# Patient Record
Sex: Male | Born: 1980 | Race: White | Hispanic: No | Marital: Single | Smoking: Never smoker
Health system: Southern US, Community
[De-identification: ages and names within clinical notes are randomized; demographics above are authoritative.]

## PROBLEM LIST (undated history)

## (undated) DIAGNOSIS — I10 Essential (primary) hypertension: Secondary | ICD-10-CM

## (undated) DIAGNOSIS — F419 Anxiety disorder, unspecified: Secondary | ICD-10-CM

---

## 2009-12-12 ENCOUNTER — Ambulatory Visit: Payer: Self-pay | Admitting: Family Medicine

## 2011-01-17 ENCOUNTER — Emergency Department: Payer: Self-pay | Admitting: *Deleted

## 2012-01-25 ENCOUNTER — Observation Stay: Payer: Self-pay | Admitting: Internal Medicine

## 2012-01-25 DIAGNOSIS — R079 Chest pain, unspecified: Secondary | ICD-10-CM

## 2012-01-25 LAB — CK TOTAL AND CKMB (NOT AT ARMC)
CK, Total: 149 U/L (ref 35–232)
CK, Total: 134 U/L (ref 35–232)
CK-MB: 0.9 ng/mL (ref 0.5–3.6)
CK-MB: 0.9 ng/mL (ref 0.5–3.6)
CK-MB: 0.6 ng/mL (ref 0.5–3.6)

## 2012-01-25 LAB — CBC
HGB: 15.1 g/dL (ref 13.0–18.0)
MCH: 29.6 pg (ref 26.0–34.0)
MCV: 86 fL (ref 80–100)
RBC: 5.11 10*6/uL (ref 4.40–5.90)

## 2012-01-25 LAB — BASIC METABOLIC PANEL
Anion Gap: 12 (ref 7–16)
BUN: 10 mg/dL (ref 7–18)
EGFR (Non-African Amer.): >60
Glucose: 140 mg/dL — ABNORMAL HIGH (ref 65–99)
Osmolality: 284 (ref 275–301)

## 2012-01-25 LAB — TROPONIN I
Troponin-I: <0.02 ng/mL
Troponin-I: <0.02 ng/mL
Troponin-I: <0.02 ng/mL

## 2012-01-25 LAB — SEDIMENTATION RATE: Erythrocyte Sed Rate: 1 mm/hr (ref 0–15)

## 2012-01-25 LAB — DRUG SCREEN, URINE
Amphetamines, Ur Screen: NEGATIVE (ref ?–1000)
Cocaine Metabolite,Ur ~~LOC~~: NEGATIVE (ref ?–300)
MDMA (Ecstasy)Ur Screen: NEGATIVE (ref ?–500)
Opiate, Ur Screen: NEGATIVE (ref ?–300)
Tricyclic, Ur Screen: NEGATIVE (ref ?–1000)

## 2012-01-25 LAB — MAGNESIUM: Magnesium: 1.8 mg/dL

## 2013-09-07 ENCOUNTER — Emergency Department: Payer: Self-pay | Admitting: Emergency Medicine

## 2013-09-07 LAB — CBC
HCT: 47.8 %
HGB: 16.5 g/dL
MCH: 30.3 pg
MCHC: 34.6 g/dL
MCV: 88 fL
Platelet: 224 x10 3/mm 3
RBC: 5.46 x10 6/mm 3
RDW: 13.3 %
WBC: 9.2 x10 3/mm 3

## 2013-09-07 LAB — BASIC METABOLIC PANEL WITH GFR
Anion Gap: 7
BUN: 12 mg/dL
Calcium, Total: 9.1 mg/dL
Chloride: 103 mmol/L
Co2: 25 mmol/L
Creatinine: 1.42 mg/dL — ABNORMAL HIGH
EGFR (African American): >60
EGFR (Non-African Amer.): >60
Glucose: 119 mg/dL — ABNORMAL HIGH
Osmolality: 271
Potassium: 3.8 mmol/L
Sodium: 135 mmol/L — ABNORMAL LOW

## 2013-09-07 LAB — TROPONIN I: Troponin-I: <0.02 ng/mL

## 2013-09-07 LAB — LIPASE, BLOOD: Lipase: 254 U/L

## 2014-08-07 NOTE — H&P (Signed)
PATIENT NAME:  Erik Mckay, Erik Mckay MR#:  [id number] DATE OF BIRTH:  Aug 23, 1980  DATE OF ADMISSION:  01/25/2012  PRIMARY C[facility] PHYSIC[facility]: [facility].   HISTORY OF PRESENT ILLNESS: This is a 34 year old Caucasian male patient with no significant past medical history who presents to the Emergency Room complaining of acute onset of left-sided chest pain radiating to the left arm. This is associated with shortness of breath, nausea, and palpitations. The patient has had similar pain over the last three days. This tends to start while the patient is sleeping at night and resolves by morning. He has not noticed any pain with exertion. He had similar episodes twice in the prior month, was [facility], and was sent home with diagnosis of gastroesophageal reflux disease. He mentions that his pain feels better when he sits up. This tends to have a pleuritic component with worsening on taking a deep breath at times. He also mentioned some pain in his legs and says that when he presses on his legs his pain in the chest gets worse. He has some tingling in his fingers bilaterally at times.   His dad had multiple cardiac catheterizations with his initial diagnosis of coronary artery disease with multiple arteries involved, in his 30s. Presently his dad is in his 6250s and has had five heart attacks, as per the patient, with multiple cardiac catheterizations and no bypass. Multiple family members on his father's side have coronary artery disease.   Here his EKG shows T wave inversions in the inferior leads, two sets of cardiac enzymes normal, and secondary to her a typical symptoms and the family history the patient is being admitted for further work-up.   PAST MEDICAL HISTORY:  1. Tobacco abuse. 2. Marijuana abuse.   SOCIAL HISTORY: The patient smokes 2 to 3 cigarettes a week. Occasional alcohol. Smokes marijuana and he smoked marijuana earlier today.   ALLERGIES: Ciprofloxacin, Levaquin, Phenergan and Bactrim.    HOME MEDICATIONS: Prilosec 20 mg oral once a day.   REVIEW OF SYSTEMS: CONSTITUTIONAL: No fatigue, weakness, weight loss, or weight gain. EYES: No discharge, pain, redness, or blurred vision. ENT: Has sinus congestion since having sinusitis infection a few weeks back. No tinnitus. CARDIOVASCULAR: Has chest pain. No edema or orthopnea. RESPIRATORY: Has shortness of breath. No wheezing or cough or hemoptysis. GI: No nausea, vomiting, diarrhea, or abdominal pain. GENITOURINARY: No dysuria or hematuria. SKIN: No petechiae, rash, or ulcers. MUSCULOSKELETAL: No joint pain, swelling, or redness. No myalgias. NEUROLOGIC: No numbness, weakness, or dysarthria. HEMATOLOGIC: No anemia, bleeding, or easy bruising. ENDOCRINE: No polyuria or thyroid problems. PSYCHIATRIC: No anxiety or depression.   PHYSICAL EXAMINATION:   VITAL SIGNS: Temperature 98, pulse 72, blood pressure 119/63, and saturating 99% on room air, breathing 15 per minute.  GENERAL: Obese, Caucasian male patient lying in bed, comfortable, conversational, cooperative with exam.   PSYCHIATRIC: Awake, alert and oriented x3, anxious, good judgment.   HEENT: Atraumatic, normocephalic. Oral mucosa moist and pink. External ears and nose normal. No pallor. No icterus. Pupils bilaterally equal and reactive to light.   NECK: Supple. No thyromegaly. No palpable lymph nodes. Trachea midline. No carotid bruit or JVD.   CARDIOVASCULAR: S1 and S2 regular rate and rhythm without any murmurs. Peripheral pulses 2+. Has tenderness on pushing on the left side.   RESPIRATORY: Normal work of breathing. Clear to auscultation on both sides.   GASTROINTESTINAL: Soft abdomen, nontender. Bowel sounds present. No hepatosplenomegaly palpable.   SKIN: Warm and dry. No  petechiae, rash, or ulcers.   MUSCULOSKELETAL: No joint swelling, redness, or effusion of the large joints. Normal muscle tone.   NEUROLOGICAL: Motor strength five out of five in upper and lower  extremities. Sensation to fine touch intact all over.   LYMPHATIC: No cervical or axillary lymphadenopathy.   LABORATORY, DIAGNOSTIC AND RADIOLOGIC DATA: Lab studies show glucose 140, BUN 10, creatinine 1.39, sodium 142, potassium 3.4, and chloride 105. CK 179/149. MB negative. Troponin less than 0.02 x2. WBC 9.5, hemoglobin 15.1, and platelets 234.   EKG shows normal sinus rhythm with T wave inversions in inferior leads. No prior EKGs to compare with.   Chest x-ray shows no acute abnormalities.   ASSESSMENT AND PLAN:  1. Atypical chest pain in a 34 year old patient with family history of premature coronary artery disease in his father in his 22s. Two sets of cardiac enzymes are normal, but does have EKG changes and seems to have worsening symptoms over the last three days compared to previous month. Could be unstable angina. With EKG and atypical symptoms, we will consult cardiology for further help with the case. The patient will be started on aspirin.  2. Tobacco abuse. I have counseled the patient for more than three minutes to quit smoking. The patient mentions that he does not smoke much and would like to quit considering his risks to the heart after explaining.  3. Marijuana abuse. This could be involved with his symptoms. We will check a urine drug screen.  4. DVT prophylaxis with TEDs.  CODE STATUS: FULL CODE.             TIME SPENT: Time spent today on this case was more than 55 minutes with greater than 50% time spent in coordination of care. ____________________________ Molinda Bailiff. Rukiya Hodgkins, MD srs:slb Mckay: 01/25/2012 07:06:28 ET T: 01/25/2012 08:23:27 ET JOB#: [id number]  cc: Wardell Heath R. Nga Rabon, MD, <Dictator> [facility] Orie Fisherman MD ELECTRONICALLY SIGNED 02/15/2012 13:57

## 2014-08-07 NOTE — Discharge Summary (Signed)
PATIENT NAME:  Erik Mckay, Erik Mckay MR#:  [id number] DATE OF BIRTH:  1980-08-10  DATE OF ADMISSION:  01/25/2012 DATE OF DISCHARGE:  01/25/2012  DIAGNOSES:  1. Atypical chest pain, unclear etiology, likely noncardiac. Differential diagnosis includes anxiety attack, gastroesophageal reflux disease.   2. Smoking, marijuana use.   DISPOSITION: Patient is being discharged home.   FOLLOW UP: Follow up with Dr. Rockey Situ and PCP with [facility] in 1 to 2 weeks after discharge.   DIET: Regular.   ACTIVITY: As tolerated.   DISCHARGE MEDICATIONS:  1. Prilosec 20 mg daily.  2. Propranolol 20 mg. Patient has been advised to take half tablet as needed for symptoms/palpitations and can increase to 20 mg as needed once a day for symptoms.   CONSULTATION: Cardiology consultation with Dr. Rockey Situ.   LABORATORY, DIAGNOSTIC, AND RADIOLOGICAL DATA: Treadmill stress test showed no evidence of ischemia or EKG changes. Chest x-ray showed no acute abnormalities. CBC normal. ESR normal. Cardiac enzymes negative. UDS positive for marijuana. Glucose 140, BUN 10, creatinine 1.39, sodium 142, potassium 3.4.   HOSPITAL COURSE: Patient is a 34 year old male with history of smoking and marijuana use and family history of coronary artery disease who presented with very atypical symptoms. He also had multiple somatic complaints. His chest pain was felt to be very atypical and possibly noncardiac. He was evaluated by Dr. Rockey Situ who recommended doing an inpatient treadmill stress test in view of patient's symptoms and family history. His stress test showed no evidence of ischemia. Differential diagnoses for his symptoms include anxiety and panic attacks and possible GI source since patient has acid reflux. He has been recommended to take propranolol as needed once a day for tachycardia. He has been given prescription and instructions for how to take it. Dr. Rockey Situ with follow the patient in his office if he continues to have  symptoms and arrange for outpatient Holter if symptoms persist. Patient was discharged home in a stable condition.   TIME SPENT: 45 minutes.   ____________________________ Cherre Huger, MD sp:cms D: 01/26/2012 15:07:33 ET T: 01/27/2012 09:51:03 ET JOB#: [id number]  cc: Cherre Huger, MD, <Dictator> [facility]  Cherre Huger MD ELECTRONICALLY SIGNED 01/27/2012 14:18

## 2014-08-07 NOTE — Consult Note (Signed)
General Aspect 34 year old Caucasian male patient with h/o GERD, long hx of atypical sx including left side chest and neck pain,  presenting to the Emergency Room complaining of acute onset of left-sided chest pain radiating to the left arm while supine. Cardiology was consulted for chest pain sx.  He reports waxing waning sx for the past 8 months. Sx worse ast year after sinusitis. Recently, he has had left chest pain radiating to his left arm, and numerous other  symptoms, for the past few nights when supine. Also with some palpitations. He does report having stress/anxiety. He has not noticed any pain with exertion. He had similar episodes twice in the prior month, was seen at [facility], and was sent home with diagnosis of gastroesophageal reflux disease. He mentions that his pain feels better when he sits up. This tends to have a pleuritic component with worsening on taking a deep breath at times. He also mentioned some pain in his legs.  His dad had multiple cardiac catheterizations with his initial diagnosis of coronary artery disease with multiple arteries involved, in his 34s. Presently his dad is in his 36s and has had five heart attacks, as per the patient, with multiple cardiac catheterizations and no bypass. Multiple family members on his father's side have coronary artery disease.   Here his EKG shows T wave inversions in the inferior leads, two sets of cardiac enzymes normal, and secondary to her a typical symptoms and the family history the patient is being admitted for further work-up.   Physical Exam:   GEN well developed, well nourished, no acute distress    HEENT red conjunctivae    NECK supple    RESP normal resp effort  clear BS    CARD Regular rate and rhythm    ABD denies tenderness  soft    LYMPH negative neck    EXTR negative edema    SKIN normal to palpation    NEURO motor/sensory function intact    PSYCH alert, A+O to time, place, person, good insight   Review  of Systems:   Subjective/Chief Complaint chest pain, left arm numb, neck and head pain, ai in legs, LUQ discomfort    General: Trouble sleeping    Skin: No Complaints    ENT: No Complaints    Eyes: No Complaints    Neck: No Complaints    Respiratory: No Complaints    Cardiovascular: Chest pain or discomfort    Gastrointestinal: No Complaints    Genitourinary: No Complaints    Vascular: No Complaints    Musculoskeletal: No Complaints    Neurologic: No Complaints    Hematologic: No Complaints    Endocrine: No Complaints    Psychiatric: No Complaints    Review of Systems: All other systems were reviewed and found to be negative    Medications/Allergies Reviewed Medications/Allergies reviewed     GERD:        Admit Diagnosis:   CHEST PAIN: 25-Jan-2012, Active, CHEST PAIN  Home Medications: Medication Instructions Status  Prilosec  orally  Active   Lab Results: Routine Chem:  07-Oct-13 00:18    Magnesium, Serum 1.8 (1.8-2.4 THERAPEUTIC RANGE: 4-7 mg/dL TOXIC: > 10 mg/dL  -----------------------)   BUN 10   Creatinine (comp)  1.39   Sodium, Serum 142   Potassium, Serum  3.4   Chloride, Serum 105   CO2, Serum 25   Calcium (Total), Serum 8.9   Anion Gap 12   Osmolality (calc) 284   eGFR (  African American) >60   eGFR (Non-African American) >60 (eGFR values <100mL/min/1.73 m2 may be an indication of chronic kidney disease (CKD). Calculated eGFR is useful in patients with stable renal function. The eGFR calculation will not be reliable in acutely ill patients when serum creatinine is changing rapidly. It is not useful in  patients on dialysis. The eGFR calculation may not be applicable to patients at the low and high extremes of body sizes, pregnant women, and vegetarians.)  Cardiac:  07-Oct-13 00:18    Troponin I < 0.02 (0.00-0.05 0.05 ng/mL or less: NEGATIVE  Repeat testing in 3-6 hrs  if clinically indicated. >0.05 ng/mL: POTENTIAL  MYOCARDIAL  INJURY. Repeat  testing in 3-6 hrs if  clinically indicated. NOTE: An increase or decrease  of 30% or more on serial  testing suggests a  clinically important change)   CK, Total 179   CPK-MB, Serum 0.9 (Result(s) reported on 25 Jan 2012 at 01:07AM.)    05:10    Troponin I < 0.02 (0.00-0.05 0.05 ng/mL or less: NEGATIVE  Repeat testing in 3-6 hrs  if clinically indicated. >0.05 ng/mL: POTENTIAL  MYOCARDIAL INJURY. Repeat  testing in 3-6 hrs if  clinically indicated. NOTE: An increase or decrease  of 30% or more on serial  testing suggests a  clinically important change)   CK, Total 149   CPK-MB, Serum 0.9 (Result(s) reported on 25 Jan 2012 at 05:32AM.)  Routine Hem:  07-Oct-13 00:18    WBC (CBC) 9.5   RBC (CBC) 5.11   Hemoglobin (CBC) 15.1   Hematocrit (CBC) 43.8   Platelet Count (CBC) 234 (Result(s) reported on 25 Jan 2012 at 12:56AM.)   MCV 86   MCH 29.6   MCHC 34.5   RDW 13.7   EKG:   Interpretation EKG shows NSR with nonspecific T wave abn in III, AVF    ciprofloxacin: Unknown  Levaquin: Unknown  Phenergan: Unknown  Bactrim: Unknown  Vital Signs/Nurse's Notes: **Vital Signs.:   07-Oct-13 08:22   Vital Signs Type Admission   Temperature Temperature (F) 98.1   Celsius 36.7   Temperature Source Oral   Pulse Pulse 65   Respirations Respirations 18   Systolic BP Systolic BP 749   Diastolic BP (mmHg) Diastolic BP (mmHg) 87   Mean BP 103   Pulse Ox % Pulse Ox % 97   Pulse Ox Activity Level  At rest   Oxygen Delivery Room Air/ 21 %     Impression 34 year old Caucasian male patient with h/o GERD, long hx of atypical sx including left side chest and neck pain,  presenting to the Emergency Room complaining of acute onset of left-sided chest pain radiating to the left arm while supine. Cardiology was consulted for chest pain sx.  1) Atypical chest pain cardiac enz neg x 2 treadmill planned for today given family hx. Continue aspirin, follow up with PMD if  treadmill normal Other differential includes anxiety/panic atacks, musculoskeletal pain, pericarditis (somewhat atypical given the numerous other complaints), recreational drug use side effects   2)Gerd: would continue omeprazole, consider doubling dose if sx persist  3) Recreactional drug use: recomended smoking cessation, MAJ   Electronic Signatures: Ida Rogue (MD)  (Signed 07-Oct-13 09:59)  Authored: General Aspect/Present Illness, History and Physical Exam, Review of System, Past Medical History, Health Issues, Home Medications, Labs, EKG , Allergies, Vital Signs/Nurse's Notes, Impression/Plan   Last Updated: 07-Oct-13 09:59 by Ida Rogue (MD)

## 2016-03-06 ENCOUNTER — Emergency Department
Admission: EM | Admit: 2016-03-06 | Discharge: 2016-03-06 | Disposition: A | Discharge status: Home / Self Care | Payer: Medicaid Other | Attending: Student in an Organized Health Care Education/Training Program | Admitting: Student in an Organized Health Care Education/Training Program

## 2016-03-06 ENCOUNTER — Encounter: Payer: Self-pay | Admitting: Emergency Medicine

## 2016-03-06 ENCOUNTER — Emergency Department: Payer: Medicaid Other

## 2016-03-06 DIAGNOSIS — Y999 Unspecified external cause status: Secondary | ICD-10-CM | POA: Diagnosis not present

## 2016-03-06 DIAGNOSIS — Y9389 Activity, other specified: Secondary | ICD-10-CM | POA: Diagnosis not present

## 2016-03-06 DIAGNOSIS — W228XXA Striking against or struck by other objects, initial encounter: Secondary | ICD-10-CM | POA: Diagnosis not present

## 2016-03-06 DIAGNOSIS — Y929 Unspecified place or not applicable: Secondary | ICD-10-CM | POA: Diagnosis not present

## 2016-03-06 DIAGNOSIS — I10 Essential (primary) hypertension: Secondary | ICD-10-CM | POA: Insufficient documentation

## 2016-03-06 DIAGNOSIS — F17200 Nicotine dependence, unspecified, uncomplicated: Secondary | ICD-10-CM | POA: Diagnosis not present

## 2016-03-06 DIAGNOSIS — Z23 Encounter for immunization: Secondary | ICD-10-CM | POA: Diagnosis not present

## 2016-03-06 DIAGNOSIS — M795 Residual foreign body in soft tissue: Secondary | ICD-10-CM | POA: Insufficient documentation

## 2016-03-06 HISTORY — DX: Anxiety disorder, unspecified: F41.9

## 2016-03-06 HISTORY — DX: Essential (primary) hypertension: I10

## 2016-03-06 MED ORDER — TETANUS-DIPHTH-ACELL PERTUSSIS 5-2.5-18.5 LF-MCG/0.5 IM SUSP
0.5000 mL | Freq: Once | INTRAMUSCULAR | Status: AC
  Administered 2016-03-06: 0.5 mL via INTRAMUSCULAR

## 2016-03-06 MED ORDER — CEPHALEXIN 500 MG PO CAPS: 500.0000 mg | ORAL_CAPSULE | Freq: Two times a day (BID) | ORAL | 0 refills | Status: AC

## 2016-03-06 MED ORDER — LIDOCAINE-EPINEPHRINE (PF) 1 %-1:200000 IJ SOLN
10.0000 mL | Freq: Once | INTRAMUSCULAR | Status: AC
  Administered 2016-03-06: 30 mL

## 2016-03-06 MED ORDER — LIDOCAINE-EPINEPHRINE 2 %-1:100000 IJ SOLN: 1.7000 mL | Freq: Once | INTRAMUSCULAR | Status: DC

## 2016-03-06 MED ORDER — LIDOCAINE-EPINEPHRINE (PF) 1 %-1:200000 IJ SOLN
INTRAMUSCULAR | Status: AC
  Administered 2016-03-06: 30 mL
  Filled 2016-03-06: qty 30

## 2016-03-06 MED ORDER — NAPROXEN 500 MG PO TBEC: 500.0000 mg | DELAYED_RELEASE_TABLET | Freq: Two times a day (BID) | ORAL | 0 refills | Status: AC

## 2016-03-06 MED ORDER — KETOROLAC TROMETHAMINE 60 MG/2ML IM SOLN
60.0000 mg | Freq: Once | INTRAMUSCULAR | Status: AC
  Administered 2016-03-06: 60 mg via INTRAMUSCULAR
  Filled 2016-03-06: qty 2

## 2016-03-06 MED ORDER — TETANUS-DIPHTH-ACELL PERTUSSIS 5-2.5-18.5 LF-MCG/0.5 IM SUSP
INTRAMUSCULAR | Status: AC
  Administered 2016-03-06: 0.5 mL via INTRAMUSCULAR
  Filled 2016-03-06: qty 0.5

## 2016-03-06 NOTE — ED Provider Notes (Signed)
[facility] Emergency Department Provider Note  ____________________________________________   First MD Initiated Contact with Patient 03/06/16 2104     (approximate)  I have reviewed the triage vital signs and the nursing notes.   HISTORY  Chief Complaint Foreign Body    HPI Erik Mckay is a 35 y.o. male presenting to the emergency department after particulate matter from a piece of a hammer lodged into his leftforearm while working on the tailgate of his pickup at 6:00 PM today. Patient describes pain as aching and 5/10 in intensity. Patient has been actively using the extremity. He denies numbness, tingling or coldness of the involved extremity. Patient states that he is unsure about last tetanus booster. He denies fever and chills. He has not taken any medication to relieve symptoms. He is left handed.    Past Medical History:  Diagnosis Date  . Anxiety   . Hypertension     There are no active problems to display for this patient.   History reviewed. No pertinent surgical history.  Prior to Admission medications   Medication Sig Start Date End Date Taking? Authorizing Provider  cephALEXin (KEFLEX) 500 MG capsule Take 1 capsule (500 mg total) by mouth 2 (two) times daily. 03/06/16 03/16/16  Orvil FeilJaclyn M Leonie Amacher, PA-C  naproxen (EC NAPROSYN) 500 MG EC tablet Take 1 tablet (500 mg total) by mouth 2 (two) times daily with a meal. 03/06/16 03/06/17  Orvil FeilJaclyn M Brighid Koch, PA-C    Allergies Bactrim [sulfamethoxazole-trimethoprim]; Levaquin [levofloxacin in d5w]; and Phenergan [promethazine hcl]  History reviewed. No pertinent family history.  Social History Social History  Substance Use Topics  . Smoking status: Never Smoker  . Smokeless tobacco: Current User  . Alcohol use Yes    Review of Systems Constitutional: No fever/chills Respiratory: Denies shortness of breath. Gastrointestinal: No abdominal pain.  No nausea, no vomiting.    Musculoskeletal: Has left forearm pain. Skin: Patient has 1 cm laceration at entrance site for foreign body. Neurological: Negative for headaches, focal weakness or numbness.  ____________________________________________   PHYSICAL EXAM:  VITAL SIGNS: ED Triage Vitals  Enc Vitals Group     BP 03/06/16 2101 (!) 162/81     Pulse Rate 03/06/16 2101 83     Resp 03/06/16 2101 16     Temp 03/06/16 2101 98.6 F (37 C)     Temp Source 03/06/16 2101 Oral     SpO2 03/06/16 2101 100 %     Weight 03/06/16 2102 225 lb (102.1 kg)     Height 03/06/16 2102 5\' 9"  (1.753 m)     Head Circumference --      Peak Flow --      Pain Score 03/06/16 2102 4     Pain Loc --      Pain Edu? --      Excl. in GC? --    Constitutional: Alert and oriented. Well appearing and in no acute distress. Eyes: Conjunctivae are normal. PERRL. EOMI. Head: Atraumatic. Nose: No congestion/rhinnorhea. Cardiovascular: Normal rate, regular rhythm. Grossly normal heart sounds.  Good peripheral circulation. Respiratory: Normal respiratory effort.  No retractions. Lungs CTAB. Musculoskeletal: Patient has 5 out of 5 strength in the upper extremities bilaterally. Reflexes 2+ symmetrically and bilaterally.  Neurologic:  Normal speech and language. No gross focal neurologic deficits are appreciated. Skin: Patient has 1 cm laceration from foreign body entrance at left forearm. Hematologic: Patient has palpable radial and ulnar pulses in the left upper extremity.  LABS (all labs  ordered are listed, but only abnormal results are displayed)  Labs Reviewed - No data to display ____________________________________________   RADIOLOGY  I, Orvil FeilJaclyn M Alandra Sando, personally viewed and evaluated these images (plain radiographs) as part of my medical decision making, as well as reviewing the written report by the radiologist.  Forearm x-ray:  Single metallic foreign body (5x11 mm) at the radial volar aspect of the proximal forearm.     PROCEDURES  Procedures:  Foreign Body Removal:  Procedure explained and permission received from Erik Mckay  Body Part: Left forearm Anesthesia: 3mL of 1% lidocaine with epi Technique: A 1 mm incision was made 3 cm superior to foreign body entrance site at place of maximal tenderness in association with x-ray coordinates. A foreign body could not be localized. A single simple interrupted 4-0 Ethilon suture was placed at incision site for foreign body removal. Patient was advised to have primary care provider remove suture in one week.  Blood loss: 3 mL  Patient tolerated the procedure well with no immediate complications.    INITIAL IMPRESSION / ASSESSMENT AND PLAN / ED COURSE  Pertinent labs & imaging results that were available during my care of the patient were reviewed by me and considered in my medical decision making (see chart for details).  Clinical Course    Assessment and Plan:  Foreign Body:  Patient had particulate matter from a hammer lodged into his left forearm today at approximately 6:00 PM. DG forearm x-ray results indicated a single metallic foreign body (5x11 mm) at the radial volar aspect of the proximal forearm. Patient underwent foreign body exploration in the emergency department. A foreign body could not be removed. A single simple interrupted suture using 4-0 Ethilon was placed at foreign body exploration site. Patient was advised to have suture removed in 1 week by his primary care provider. Patient received Tdap (BOOSTRIX) injection in the emergency department. Patient received an injection of Toradol. He was discharged on Keflex. Patient education was provided regarding return precautions. All patient questions were answered. A referral was made to surgery. ____________________________________________   FINAL CLINICAL IMPRESSION(S) / ED DIAGNOSES  Final diagnoses:  Foreign body (FB) in soft tissue      NEW MEDICATIONS STARTED DURING THIS  VISIT:  Discharge Medication List as of 03/06/2016 10:24 PM    START taking these medications   Details  cephALEXin (KEFLEX) 500 MG capsule Take 1 capsule (500 mg total) by mouth 2 (two) times daily., Starting Fri 03/06/2016, Until Mon 03/16/2016, Print    naproxen (EC NAPROSYN) 500 MG EC tablet Take 1 tablet (500 mg total) by mouth 2 (two) times daily with a meal., Starting Fri 03/06/2016, Until Sat 03/06/2017, Print         Note:  This document was prepared using Dragon voice recognition software and may include unintentional dictation errors.    Orvil FeilJaclyn M Daxter Paule, PA-C 03/06/16 2255    Willy EddyPatrick Robinson, MD 03/06/16 [phone]

## 2016-03-06 NOTE — ED Notes (Signed)
Pt with less than 0.25 cm puncture wound noted to posterior left mid forearm with controlled bleeding. Pt states a piece of hammer broke off striking skin. Pt states he believes foreign body in in arm.

## 2016-03-06 NOTE — ED Triage Notes (Signed)
Pt states piece of fb from hammer under left forearm. Area noted to left mid anterior forearm.

## 2016-10-11 ENCOUNTER — Emergency Department
Admission: EM | Admit: 2016-10-11 | Discharge: 2016-10-11 | Disposition: A | Discharge status: Home / Self Care | Payer: Self-pay | Attending: Emergency Medicine | Admitting: Emergency Medicine

## 2016-10-11 ENCOUNTER — Encounter: Payer: Self-pay | Admitting: Emergency Medicine

## 2016-10-11 DIAGNOSIS — I10 Essential (primary) hypertension: Secondary | ICD-10-CM | POA: Insufficient documentation

## 2016-10-11 DIAGNOSIS — Z79899 Other long term (current) drug therapy: Secondary | ICD-10-CM | POA: Insufficient documentation

## 2016-10-11 DIAGNOSIS — F410 Panic disorder [episodic paroxysmal anxiety] without agoraphobia: Secondary | ICD-10-CM | POA: Insufficient documentation

## 2016-10-11 MED ORDER — DIAZEPAM 5 MG PO TABS: 5.0000 mg | ORAL_TABLET | Freq: Three times a day (TID) | ORAL | 0 refills | Status: DC | PRN

## 2016-10-11 MED ORDER — DIAZEPAM 2 MG PO TABS
2.0000 mg | ORAL_TABLET | Freq: Once | ORAL | Status: AC
  Administered 2016-10-11: 2 mg via ORAL
  Filled 2016-10-11: qty 1

## 2016-10-11 NOTE — ED Provider Notes (Signed)
[facility] Emergency Department Provider Note   ____________________________________________   I have reviewed the triage vital signs and the nursing notes.   HISTORY  Chief Complaint Panic Attack    HPI Erik Mckay is a 36 y.o. male presents with anxiety that developed earlier this morning. Patient has a history of anxiety that he treats with Celexa and Valium he takes as needed. Patient is scheduled to see his provider that prescribed the above medications tomorrow. Patient reports compliance with Celexa and his medication bottle and remaining pills seems aligned with the history he is reporting. Patient reports no acute anxiety attacks over the last 4 months however he awoke this morning and after breakfast he is unsure of the trigger but developed significant panic symptoms and was out of his PRN valium. Patient denies fever, chills, headache, vision changes, chest pain, chest tightness, shortness of breath, abdominal pain, nausea and vomiting.  Past Medical History:  Diagnosis Date  . Anxiety   . Hypertension     There are no active problems to display for this patient.   History reviewed. No pertinent surgical history.  Prior to Admission medications   Medication Sig Start Date End Date Taking? Authorizing Provider  citalopram (CELEXA) 40 MG tablet Take 40 mg by mouth daily.   Yes [provider]  diazepam (VALIUM) 5 MG tablet Take 5 mg by mouth every 6 (six) hours as needed for anxiety (1/2 to 1 tabs as needed).   Yes [provider]  diazepam (VALIUM) 5 MG tablet Take 1 tablet (5 mg total) by mouth every 8 (eight) hours as needed for anxiety (Take 1/2 or whole tablet as needed for anxiety.). 10/11/16 10/11/17  Jashira Cotugno M, PA-C  naproxen (EC NAPROSYN) 500 MG EC tablet Take 1 tablet (500 mg total) by mouth 2 (two) times daily with a meal. 03/06/16 03/06/17  Orvil FeilWoods, Jaclyn M, PA-C    Allergies Bactrim  [sulfamethoxazole-trimethoprim]; Levaquin [levofloxacin in d5w]; and Phenergan [promethazine hcl]  History reviewed. No pertinent family history.  Social History Social History  Substance Use Topics  . Smoking status: Never Smoker  . Smokeless tobacco: Current User  . Alcohol use Yes    Review of Systems Constitutional: Negative for fever/chills Eyes: No visual changes. Cardiovascular: Denies chest pain. Respiratory: Denies cough. Denies shortness of breath. Musculoskeletal: Negative for back pain. Negative for generalized body aches. Skin:Negative for rash. Neurological: Negative for headaches.  Psychiatric: Mood normal, affect anxious.  ____________________________________________   PHYSICAL EXAM:  VITAL SIGNS: ED Triage Vitals  Enc Vitals Group     BP 10/11/16 1157 (!) 161/81     Pulse Rate 10/11/16 1157 81     Resp 10/11/16 1157 20     Temp 10/11/16 1157 98.4 F (36.9 C)     Temp Source 10/11/16 1157 Oral     SpO2 10/11/16 1157 100 %     Weight 10/11/16 1158 220 lb (99.8 kg)     Height 10/11/16 1158 5\' 8"  (1.727 m)     Head Circumference --      Peak Flow --      Pain Score --      Pain Loc --      Pain Edu? --      Excl. in GC? --     Constitutional: Alert and oriented. Well appearing and mild distress.  Head: Normocephalic and atraumatic. Eyes: Conjunctivae are normal. PERRL.  Cardiovascular: Normal rate, regular rhythm. Normal distal pulses. Respiratory: Normal respiratory  effort.  Musculoskeletal: Nontender with normal range of motion in all extremities. Neurologic: Normal speech and language.  Skin:  Skin is warm, dry and intact. No rash noted. Psychiatric: Mood normal. Affect acute anxiety. ____________________________________________   LABS (all labs ordered are listed, but only abnormal results are displayed)  Labs Reviewed - No data to  display ____________________________________________  EKG None ____________________________________________  RADIOLOGY None ____________________________________________   PROCEDURES  Procedure(s) performed: no    Critical Care performed: no ____________________________________________   INITIAL IMPRESSION / ASSESSMENT AND PLAN / ED COURSE  Pertinent labs & imaging results that were available during my care of the patient were reviewed by me and considered in my medical decision making (see chart for details).  Patient presented with mild anxiety attack today that began this morning during breakfast. Patient normally manages his anxiety with Celexa and valium as needed. Patient has been compliant with Celexa regimen and takes Valium as needed. Physical exam and history are consistent with acute anxiety attack. Patient given 2 mg Valium and patient's anxiety symptoms resolved during the course of care in emergency department. Patient given 2 days supply of Valium prescription. Patient is scheduled for a follow-up visit with his provider and will be updating his prescriptions. Patient informed of clinical course, understand medical decision-making process, and agree with plan.  Patient was advised to follow up with PCP as needed and was also advised to return to the emergency department for symptoms that change or worsen.      If controlled substance prescribed during this visit, 12 month history viewed on the [facility] prior to issuing an initial prescription for Schedule II or III opiod. ____________________________________________   FINAL CLINICAL IMPRESSION(S) / ED DIAGNOSES  Final diagnoses:  Anxiety attack       NEW MEDICATIONS STARTED DURING THIS VISIT:  Discharge Medication List as of 10/11/2016  1:15 PM    START taking these medications   Details  !! diazepam (VALIUM) 5 MG tablet Take 1 tablet (5 mg total) by mouth every 8 (eight) hours as needed for anxiety  (Take 1/2 or whole tablet as needed for anxiety.)., Starting Sun 10/11/2016, Until Mon 10/11/2017, Print     !! - Potential duplicate medications found. Please discuss with provider.       Note:  This document was prepared using Dragon voice recognition software and may include unintentional dictation errors.    Clois Comber, PA-C 10/11/16 1803    Jeanmarie Plant, MD 10/12/16 1115

## 2016-10-11 NOTE — ED Triage Notes (Signed)
Pt presents to ED c/o anxiety attack. Pt states he normally takes celexa and usually has valium where he takes 1/2 pill but he does not have any. Pt states today he feels more anxious for no real reason; mostly from known triggers. Pt states he just needs something so he can calm down. No SI/HI thoughts

## 2016-10-11 NOTE — Discharge Instructions (Signed)
Follow up with your provider or next schedule visit.

## 2016-10-11 NOTE — ED Notes (Signed)
See triage note  States he is out of valium which he takes as needed  Woke up this am with feeling anxious  Can;t be still

## 2017-07-10 ENCOUNTER — Emergency Department
Admission: EM | Admit: 2017-07-10 | Discharge: 2017-07-10 | Disposition: A | Discharge status: Home / Self Care | Payer: Self-pay | Attending: Emergency Medicine | Admitting: Emergency Medicine

## 2017-07-10 ENCOUNTER — Other Ambulatory Visit: Payer: Self-pay

## 2017-07-10 DIAGNOSIS — I10 Essential (primary) hypertension: Secondary | ICD-10-CM | POA: Insufficient documentation

## 2017-07-10 DIAGNOSIS — L509 Urticaria, unspecified: Secondary | ICD-10-CM | POA: Insufficient documentation

## 2017-07-10 MED ORDER — PREDNISONE 10 MG PO TABS: ORAL_TABLET | ORAL | 0 refills | Status: DC

## 2017-07-10 MED ORDER — RANITIDINE HCL 150 MG PO TABS: 150.0000 mg | ORAL_TABLET | Freq: Two times a day (BID) | ORAL | 0 refills | Status: DC

## 2017-07-10 MED ORDER — DEXAMETHASONE SODIUM PHOSPHATE 10 MG/ML IJ SOLN
10.0000 mg | Freq: Once | INTRAMUSCULAR | Status: AC
  Administered 2017-07-10: 10 mg via INTRAMUSCULAR
  Filled 2017-07-10: qty 1

## 2017-07-10 NOTE — ED Provider Notes (Signed)
[facility] Emergency Department Provider Note  ___________________________________________   None    (approximate)  I have reviewed the triage vital signs and the nursing notes.   HISTORY  Chief Complaint Allergic Reaction  HPI Erik Mckay is a 37 y.o. male is here complaining of rash and itching often known for the past 3 days.  Patient states that he woke up again this morning with the rash and took 50 mg of Benadryl 1 hour prior to arrival in the emergency department.  He denies any difficulty breathing, speaking, swallowing.  He denies any previous allergies.  Patient states the rash comes and goes.  Patient took pictures of rash at its worse last evening.   Past Medical History:  Diagnosis Date  . Anxiety   . Hypertension     There are no active problems to display for this patient.   No past surgical history on file.  Prior to Admission medications   Medication Sig Start Date End Date Taking? Authorizing Provider  citalopram (CELEXA) 40 MG tablet Take 40 mg by mouth daily.    [provider]  predniSONE (DELTASONE) 10 MG tablet Take 6 tablets  today, on day 2 take 5 tablets, day 3 take 4 tablets, day 4 take 3 tablets, day 5 take  2 tablets and 1 tablet the last day 07/10/17   Tommi RumpsSummers, Christasia Angeletti L, PA-C  ranitidine (ZANTAC) 150 MG tablet Take 1 tablet (150 mg total) by mouth 2 (two) times daily. 07/10/17 07/10/18  Tommi RumpsSummers, Dreonna Hussein L, PA-C    Allergies Bactrim [sulfamethoxazole-trimethoprim]; Levaquin [levofloxacin in d5w]; and Phenergan [promethazine hcl]  No family history on file.  Social History Social History   Tobacco Use  . Smoking status: Never Smoker  . Smokeless tobacco: Current User  Substance Use Topics  . Alcohol use: Yes  . Drug use: No    Review of Systems Constitutional: No fever/chills Eyes: No visual changes. ENT: No sore throat. Cardiovascular: Denies chest pain. Respiratory: Denies shortness of  breath. Gastrointestinal: No abdominal pain.  No nausea, no vomiting.  Musculoskeletal: Negative for back pain. Skin: Positive for rash. Neurological: Negative for headaches ____________________________________________   PHYSICAL EXAM:  VITAL SIGNS: ED Triage Vitals  Enc Vitals Group     BP 07/10/17 0646 (!) 158/95     Pulse Rate 07/10/17 0646 70     Resp 07/10/17 0646 18     Temp 07/10/17 0646 97.9 F (36.6 C)     Temp Source 07/10/17 0646 Oral     SpO2 07/10/17 0646 100 %     Weight 07/10/17 0642 230 lb (104.3 kg)     Height 07/10/17 0642 5\' 10"  (1.778 m)     Head Circumference --      Peak Flow --      Pain Score --      Pain Loc --      Pain Edu? --      Excl. in GC? --    Constitutional: Alert and oriented. Well appearing and in no acute distress. Eyes: Conjunctivae are normal.  Head: Atraumatic. Nose: No congestion/rhinnorhea. Mouth/Throat: Mucous membranes are moist.  Oropharynx non-erythematous.  Uvula is midline, no edema present. Neck: No stridor.   Cardiovascular: Normal rate, regular rhythm. Grossly normal heart sounds.  Good peripheral circulation. Respiratory: Normal respiratory effort.  No retractions. Lungs CTAB. Musculoskeletal: moves upper and lower extremities.   Neurologic:  Normal speech and language. No gross focal neurologic deficits are appreciated.  Skin:  Skin is warm, dry and intact.  Psychiatric: Mood and affect are normal. Speech and behavior are normal.  ____________________________________________   LABS (all labs ordered are listed, but only abnormal results are displayed)  Labs Reviewed - No data to display   PROCEDURES  Procedure(s) performed: None  Procedures  Critical Care performed: No  ____________________________________________   INITIAL IMPRESSION / ASSESSMENT AND PLAN / ED COURSE Patient presents to the emergency department with history of urticaria and is confirmed by pictures that he took on his phone last  evening of his rash.  Currently rash has improved.  Patient continues to itch and places that are slightly erythematous.  Patient was given Decadron 10 mg IM along with a prednisone pack to taper beginning with 60 mg to taper over the next 6 days.  He was also given a prescription for Zantac 50 mg twice daily.  He may continue taking Benadryl if needed for his symptoms.  He is return to the emergency department if any severe worsening of his symptoms.  We does discussed having allergy testing done should he continue to have problems in the future.  Information was given to him for this purpose. ____________________________________________   FINAL CLINICAL IMPRESSION(S) / ED DIAGNOSES  Final diagnoses:  Urticaria     ED Discharge Orders        Ordered    predniSONE (DELTASONE) 10 MG tablet     07/10/17 0726    ranitidine (ZANTAC) 150 MG tablet  2 times daily     07/10/17 1610       Note:  This document was prepared using Dragon voice recognition software and may include unintentional dictation errors.    Tommi Rumps, PA-C 07/10/17 1312    Arnaldo Natal, MD 07/10/17 [phone]

## 2017-07-10 NOTE — Discharge Instructions (Signed)
Follow-up with [facility] if any continued problems other allergy testing could be done.  You may discontinue taking Benadryl for itching and begin taking Zyrtec or Claritin as these medications will not cause drowsiness.  Begin taking prednisone as directed decreasing by 1 tablet each day and also Zantac 1 twice a day.  You may discontinue taking your Prilosec while taking the Zantac. Return to the emergency department if any severe worsening of your symptoms or inability to swallow your secretions.

## 2017-07-10 NOTE — ED Triage Notes (Addendum)
Patient reports itching and rash over body off/on for the past 3 days.  Patient took Benadryl 50 mg approximately 1 hour prior to arrival.  Patient is speaking in complete sentences and controlling own secretions without difficulty.  No acute respiratory distress noted.

## 2017-07-10 NOTE — ED Notes (Signed)
Per pt unknown allergic reaction x2days. Denies any SOB or oral swelling. No hives at this time but did have yesterday. Speech clear resp even and unlabored

## 2017-07-10 NOTE — ED Notes (Signed)
Patient ambulatory to stat desk without difficulty or distress noted.  Patient reports itching and rash off/on for the past 3 days.  Reports notices when wakes up.  Patient reports he took Benadryl 50mg  approximately 1 hour prior to arrival.

## 2017-08-11 ENCOUNTER — Other Ambulatory Visit: Payer: Self-pay | Admitting: Emergency Medicine

## 2018-06-10 IMAGING — CR DG FOREARM 2V*L*
1 series · 2 of 2 positions shown · non-contrast
Comparison: None.

CLINICAL DATA: Puncture wound due to broken Lore.

EXAM:
LEFT FOREARM - 2 VIEW

[Series 1: x forearm ap left · 0.14mm/px · 2 of 2 slices shown]
[im 1/2]
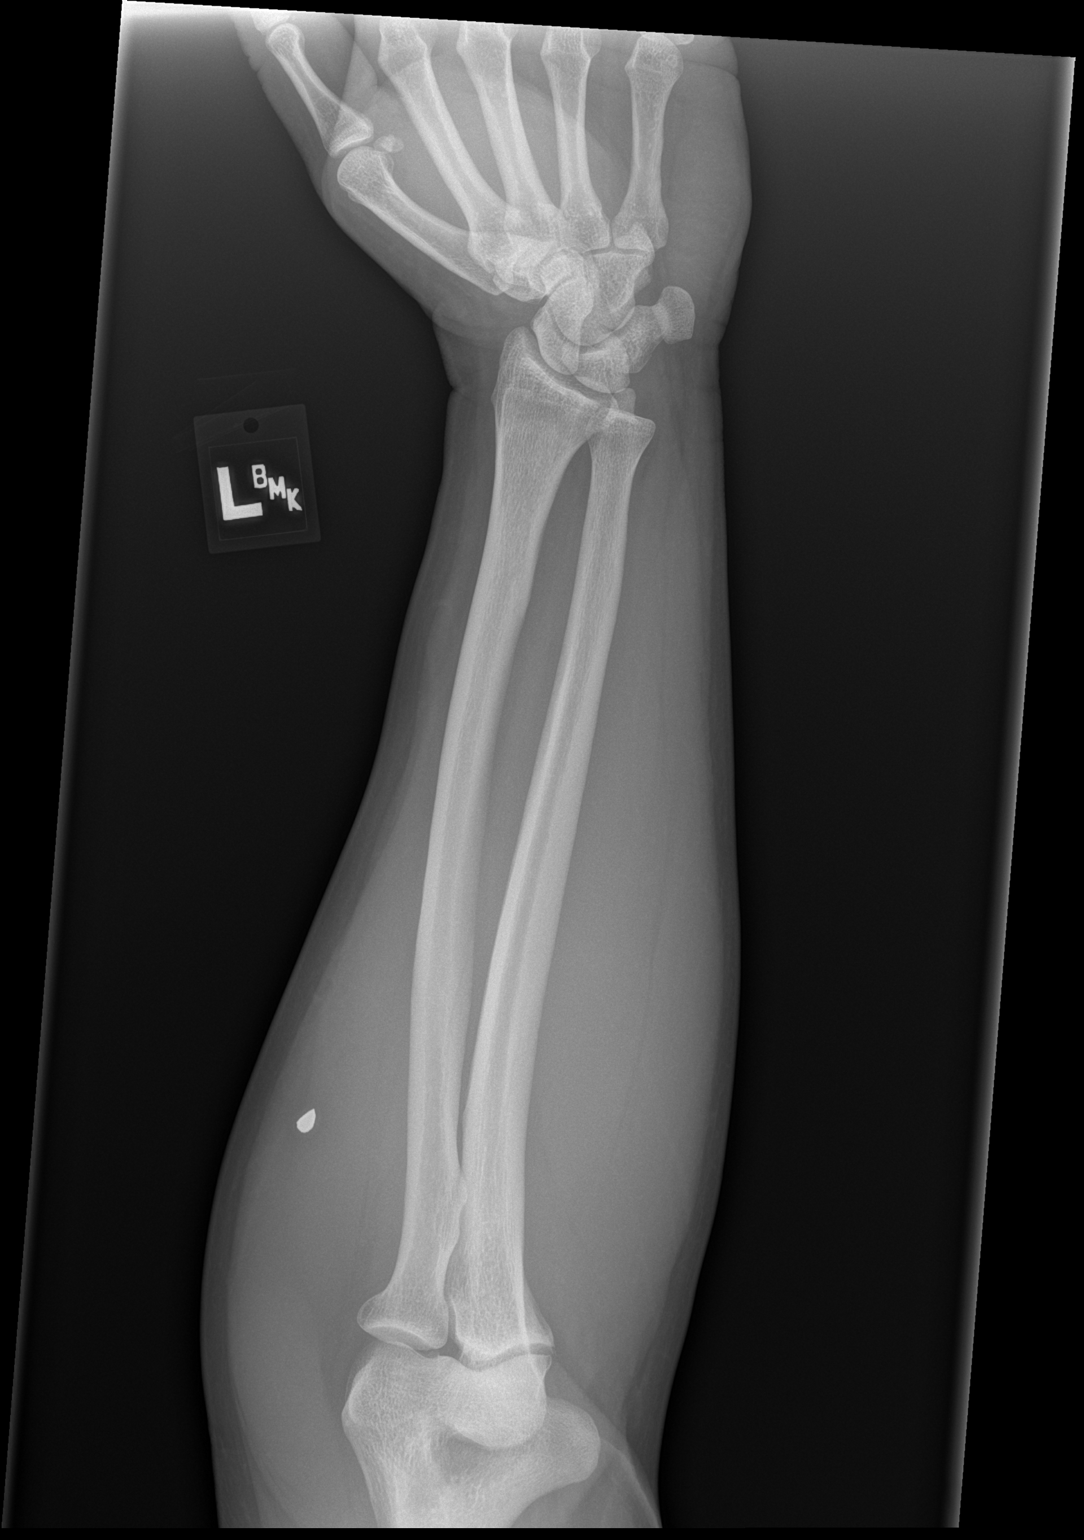
[im 2/2]
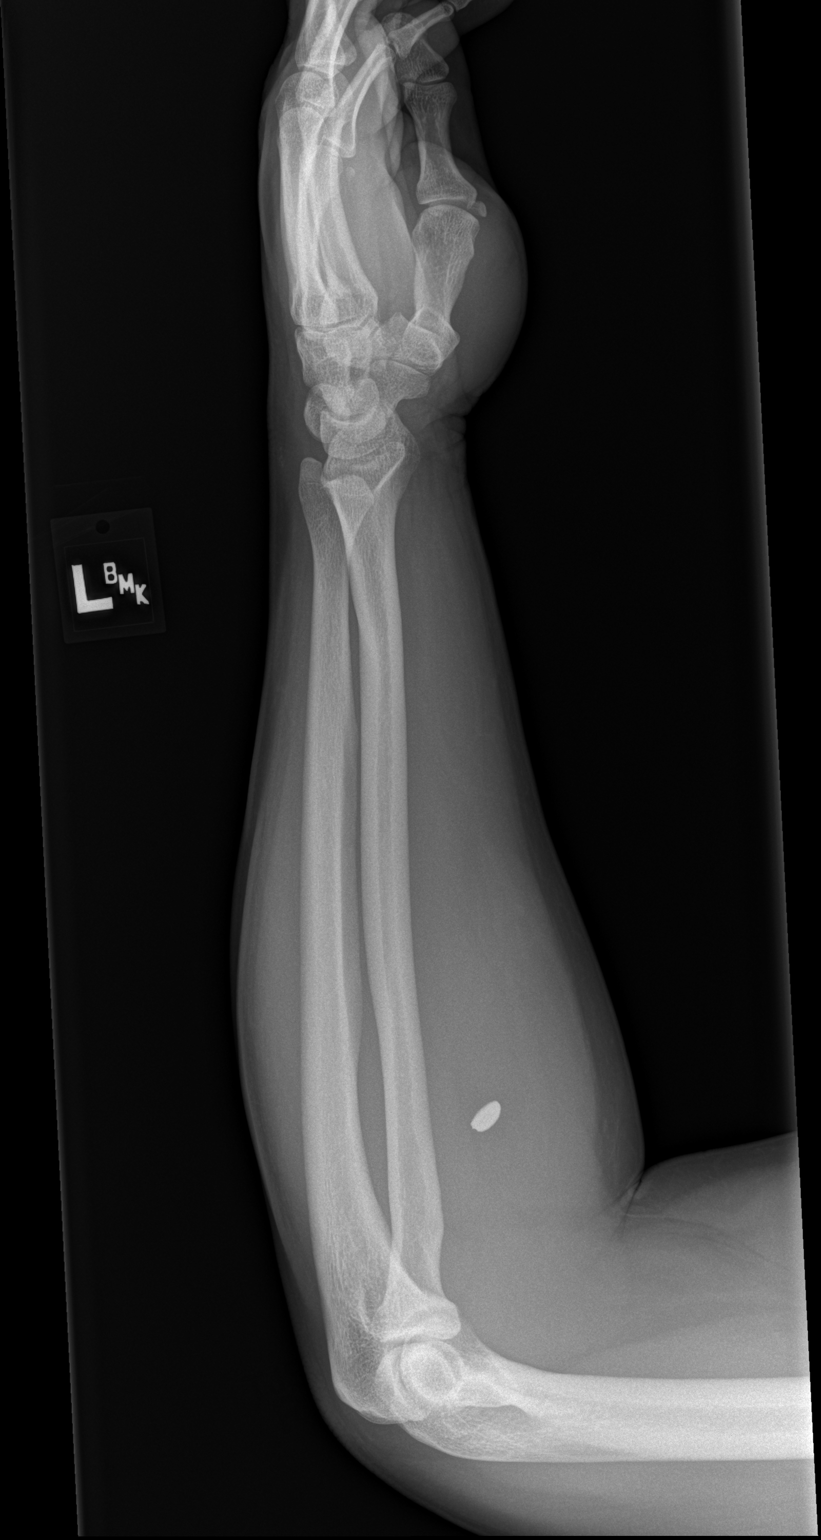

[2 of 2 positions shown; findings below may reference images not displayed]

FINDINGS: There is a single metallic foreign body located in the soft tissues
at the radial volar aspect of the proximal forearm, measuring
approximately 5 x 11 mm. No bony injury. The foreign body is no more
than 1.5 cm deep to the nearest skin surface.
IMPRESSION: Metallic soft tissue foreign body measuring 5 x 11 mm at the radial
volar aspect of the proximal forearm. No bony injury.

## 2018-10-18 ENCOUNTER — Encounter: Payer: Self-pay | Admitting: *Deleted

## 2018-10-18 ENCOUNTER — Emergency Department
Admission: EM | Admit: 2018-10-18 | Discharge: 2018-10-18 | Disposition: A | Discharge status: Home / Self Care | Payer: Self-pay | Attending: Emergency Medicine | Admitting: Emergency Medicine

## 2018-10-18 ENCOUNTER — Emergency Department: Payer: Self-pay

## 2018-10-18 ENCOUNTER — Other Ambulatory Visit: Payer: Self-pay

## 2018-10-18 DIAGNOSIS — Z79899 Other long term (current) drug therapy: Secondary | ICD-10-CM | POA: Insufficient documentation

## 2018-10-18 DIAGNOSIS — R079 Chest pain, unspecified: Secondary | ICD-10-CM | POA: Insufficient documentation

## 2018-10-18 DIAGNOSIS — I10 Essential (primary) hypertension: Secondary | ICD-10-CM | POA: Insufficient documentation

## 2018-10-18 LAB — BASIC METABOLIC PANEL
Anion gap: 10 (ref 5–15)
BUN: 10 mg/dL (ref 6–20)
CO2: 25 mmol/L (ref 22–32)
Calcium: 8.8 mg/dL — ABNORMAL LOW (ref 8.9–10.3)
Chloride: 98 mmol/L (ref 98–111)
Creatinine, Ser: 1.06 mg/dL (ref 0.61–1.24)
GFR calc Af Amer: >60 mL/min (ref >60)
GFR calc non Af Amer: >60 mL/min (ref >60)
Glucose, Bld: 99 mg/dL (ref 70–99)
Potassium: 3.6 mmol/L (ref 3.5–5.1)
Sodium: 133 mmol/L — ABNORMAL LOW (ref 135–145)

## 2018-10-18 LAB — CBC
HCT: 43 % (ref 39.0–52.0)
Hemoglobin: 15 g/dL (ref 13.0–17.0)
MCH: 30.7 pg (ref 26.0–34.0)
MCHC: 34.9 g/dL (ref 30.0–36.0)
MCV: 88.1 fL (ref 80.0–100.0)
Platelets: 189 10*3/uL (ref 150–400)
RBC: 4.88 MIL/uL (ref 4.22–5.81)
RDW: 11.9 % (ref 11.5–15.5)
WBC: 5.9 10*3/uL (ref 4.0–10.5)
nRBC: 0 % (ref 0.0–0.2)

## 2018-10-18 LAB — FIBRIN DERIVATIVES D-DIMER (ARMC ONLY): Fibrin derivatives D-dimer (ARMC): 270.6 ng/mL (FEU) (ref 0.00–499.00)

## 2018-10-18 LAB — HEPATIC FUNCTION PANEL
ALT: 110 U/L — ABNORMAL HIGH (ref 0–44)
AST: 71 U/L — ABNORMAL HIGH (ref 15–41)
Albumin: 4.3 g/dL (ref 3.5–5.0)
Alkaline Phosphatase: 54 U/L (ref 38–126)
Bilirubin, Direct: 0.2 mg/dL (ref 0.0–0.2)
Indirect Bilirubin: 0.8 mg/dL (ref 0.3–0.9)
Total Bilirubin: 1 mg/dL (ref 0.3–1.2)
Total Protein: 7.4 g/dL (ref 6.5–8.1)

## 2018-10-18 LAB — TROPONIN I (HIGH SENSITIVITY)
Troponin I (High Sensitivity): 3 ng/L (ref <18)
Troponin I (High Sensitivity): 2 ng/L (ref <18)

## 2018-10-18 MED ORDER — NAPROXEN 500 MG PO TABS: 500.0000 mg | ORAL_TABLET | Freq: Two times a day (BID) | ORAL | 0 refills | Status: AC

## 2018-10-18 NOTE — ED Triage Notes (Signed)
PT to ED via EMS with left sided chest pain that worsens with deep breathing. Pain is radiating to left shoulder. EMS gave pt 1 Nitro Spray and 324 mg ASA that pt reports decreased the pain. Last time pt experienced this pain he was dx with GERD and anxiety. Pt reports congestion with cough in the morning that resolves throughout the day.

## 2018-10-18 NOTE — Discharge Instructions (Signed)
Please take the anti inflammatory as prescribed.  Follow up with the primary care provider of your choice as soon as possible for recheck of your blood pressure. Return to the ER for any symptom of concerns if unable to see primary care.

## 2018-10-18 NOTE — ED Provider Notes (Signed)
[facility] Emergency Department Provider Note  ____________________________________________   None    (approximate)  I have reviewed the triage vital signs and the nursing notes.   HISTORY  Chief Complaint Chest Pain   HPI Erik Mckay is a 38 y.o. male who presents to the emergency department for treatment and evaluation of left side chest pain that increases with deep breath. Pain radiates into the left shoulder. EMS gave NTG spray with some relief. He also had 324 mg ASA. Pain started around 8am. Typically he has chest pain with anxiety/panic attack and takes Ativan with relief. He took it at about 9:30 without any change. Pain is in the left chest and goes through to the left shoulder blade with deep breath or movement. Previous anxiety and GERD have not radiated into the back.    Past Medical History:  Diagnosis Date  . Anxiety   . Hypertension     There are no active problems to display for this patient.   History reviewed. No pertinent surgical history.  Prior to Admission medications   Medication Sig Start Date End Date Taking? Authorizing Provider  citalopram (CELEXA) 20 MG tablet Take 20 mg by mouth daily.    Yes [provider]  omeprazole (PRILOSEC) 20 MG capsule Take 20 mg by mouth daily.   Yes [provider]  naproxen (NAPROSYN) 500 MG tablet Take 1 tablet (500 mg total) by mouth 2 (two) times daily with a meal. 10/18/18   Ferris Fielden B, FNP    Allergies Bactrim [sulfamethoxazole-trimethoprim], Levaquin [levofloxacin in d5w], and Phenergan [promethazine hcl]  History reviewed. No pertinent family history.  Social History Social History   Tobacco Use  . Smoking status: Never Smoker  . Smokeless tobacco: Current User  Substance Use Topics  . Alcohol use: Yes  . Drug use: No    Review of Systems  Constitutional: No fever/chills. Eyes: No visual changes. ENT: No sore throat. Cardiovascular:  Positive for chest pain. Positive for pleuritic pain. negative for palpitations. Negative for leg pain or swelling. Respiratory: Negative for shortness of breath. Gastrointestinal: Negative for abdominal pain. no nausea, no vomiting.  No diarrhea.  No constipation. Genitourinary: Negative for dysuria. Musculoskeletal: Positive for back pain.  Skin: Negative for rash, lesion, wound. Neurological: Negative for headaches, focal weakness or numbness.  ____________________________________________   PHYSICAL EXAM:  VITAL SIGNS: ED Triage Vitals  Enc Vitals Group     BP 10/18/18 1221 (!) 153/94     Pulse Rate 10/18/18 1221 84     Resp 10/18/18 1221 16     Temp 10/18/18 1221 99.5 F (37.5 C)     Temp Source 10/18/18 1221 Oral     SpO2 10/18/18 1221 97 %     Weight 10/18/18 1222 229 lb 15 oz (104.3 kg)     Height --      Head Circumference --      Peak Flow --      Pain Score 10/18/18 1222 5     Pain Loc --      Pain Edu? --      Excl. in [place]? --     Constitutional: Alert and oriented. Well appearing and in no acute distress. Normal mental status. Eyes: Conjunctivae are normal. PERRL. Head: Atraumatic. Nose: No congestion/rhinnorhea. Mouth/Throat: Mucous membranes are moist.  Oropharynx non-erythematous. Tongue normal in size and color. Neck: No stridor. No carotid bruit appreciated on exam. Hematological/Lymphatic/Immunilogical: No cervical lymphadenopathy. Cardiovascular: Normal rate, regular rhythm.  Grossly normal heart sounds.  Good peripheral circulation. Respiratory: Normal respiratory effort.  No retractions. Lungs CTAB. Gastrointestinal: Soft and nontender. No distention. No abdominal bruits. No CVA tenderness. Genitourinary: Exam deferred. Musculoskeletal: No lower extremity tenderness. No edema of extremities. Neurologic:  Normal speech and language. No gross focal neurologic deficits are appreciated. Skin:  Skin is warm, dry and intact. No rash noted. Psychiatric: Mood  and affect are normal. Speech and behavior are normal.  ____________________________________________   LABS (all labs ordered are listed, but only abnormal results are displayed)  Labs Reviewed  BASIC METABOLIC PANEL - Abnormal; Notable for the following components:      Result Value   Sodium 133 (*)    Calcium 8.8 (*)    All other components within normal limits  HEPATIC FUNCTION PANEL - Abnormal; Notable for the following components:   AST 71 (*)    ALT 110 (*)    All other components within normal limits  CBC  TROPONIN I (HIGH SENSITIVITY)  TROPONIN I (HIGH SENSITIVITY)  FIBRIN DERIVATIVES D-DIMER ([facility] ONLY)   ____________________________________________  EKG  ED ECG REPORT I, Eligio Angert, FNP-BC personally viewed and interpreted this ECG.   Date: 10/18/2018  EKG Time: 1216  Rate: 76  Rhythm: unchanged from previous tracings, normal sinus rhythm  Axis: normal  Intervals:none  ST&T Change: No ST elevation  ____________________________________________  RADIOLOGY  ED MD interpretation:  No acute cardiopulmonary abnormality per radiology.  Official radiology report(s): Dg Chest 2 View  Result Date: 10/18/2018 CLINICAL DATA:  Left-sided chest pain EXAM: CHEST - 2 VIEW COMPARISON:  09/07/2013 FINDINGS: The heart size and mediastinal contours are within normal limits. Both lungs are clear. The visualized skeletal structures are unremarkable. IMPRESSION: No active cardiopulmonary disease. Electronically Signed   By: Elige KoHetal  Patel   On: 10/18/2018 13:31    ____________________________________________   PROCEDURES  Procedure(s) performed: None  Procedures  Critical Care performed: No  ____________________________________________   INITIAL IMPRESSION / ASSESSMENT AND PLAN / ED COURSE  As part of my medical decision making, I reviewed the following data within the electronic MEDICAL RECORD NUMBER Old EKG reviewed and Notes from prior ED visits  38 year old male  presenting to the emergency department for evaluation of chest pain in the left chest with radiation into the left subscapular area with deep breath or movement. Symptoms started this morning around 8am while he was running a bobcat at work. He does not recall feeling a pull or having any injury. Labs, ECG, and cardiac work up will be ordered including d dimer. He is low probability for PE, but has pleuritic pain.  ----------------------------------------- 3:16 PM on 10/18/2018 -----------------------------------------  Patient resting with eyes closed. Labs including d dimer are reassuring. Initial troponin is 3. Chest x-ray shows no acute abnormality per radiology. EKG is unchanged from previous. Plan will be to do second troponin and discharge home if negative.   ----------------------------------------- 5:01 PM on 10/18/2018 -----------------------------------------  Troponin is normal. He is to be discharged home with prescription for naprosyn. He states he believes his pain is related to a "pulled muscle." He is to follow up with primary care as soon as possible for repeat blood pressure. He was given ER return precautions as well.   ____________________________________________   FINAL CLINICAL IMPRESSION(S) / ED DIAGNOSES  Final diagnoses:  Nonspecific chest pain  Hypertension, unspecified type     ED Discharge Orders         Ordered    naproxen (NAPROSYN)  500 MG tablet  2 times daily with meals     10/18/18 1656           Note:  This document was prepared using Dragon voice recognition software and may include unintentional dictation errors.    Chinita Pesterriplett, Ji Fairburn B, FNP 10/18/18 1702    Phineas SemenGoodman, [place], MD 10/18/18 [phone]

## 2018-10-18 NOTE — ED Notes (Signed)
[place] drawn and sent to lab for repeat troponin.

## 2019-04-17 ENCOUNTER — Other Ambulatory Visit: Payer: Self-pay

## 2019-04-17 ENCOUNTER — Encounter: Payer: Self-pay | Admitting: Emergency Medicine

## 2019-04-17 ENCOUNTER — Emergency Department
Admission: EM | Admit: 2019-04-17 | Discharge: 2019-04-17 | Disposition: A | Discharge status: Home / Self Care | Payer: Self-pay | Attending: Emergency Medicine | Admitting: Emergency Medicine

## 2019-04-17 DIAGNOSIS — R197 Diarrhea, unspecified: Secondary | ICD-10-CM | POA: Insufficient documentation

## 2019-04-17 DIAGNOSIS — R112 Nausea with vomiting, unspecified: Secondary | ICD-10-CM | POA: Insufficient documentation

## 2019-04-17 DIAGNOSIS — Z791 Long term (current) use of non-steroidal anti-inflammatories (NSAID): Secondary | ICD-10-CM | POA: Insufficient documentation

## 2019-04-17 DIAGNOSIS — Z79899 Other long term (current) drug therapy: Secondary | ICD-10-CM | POA: Insufficient documentation

## 2019-04-17 DIAGNOSIS — I10 Essential (primary) hypertension: Secondary | ICD-10-CM | POA: Insufficient documentation

## 2019-04-17 LAB — COMPREHENSIVE METABOLIC PANEL
ALT: 82 U/L — ABNORMAL HIGH (ref 0–44)
AST: 73 U/L — ABNORMAL HIGH (ref 15–41)
Albumin: 4.2 g/dL (ref 3.5–5.0)
Alkaline Phosphatase: 57 U/L (ref 38–126)
Anion gap: 10 (ref 5–15)
BUN: 13 mg/dL (ref 6–20)
CO2: 26 mmol/L (ref 22–32)
Calcium: 9.2 mg/dL (ref 8.9–10.3)
Chloride: 99 mmol/L (ref 98–111)
Creatinine, Ser: 1.1 mg/dL (ref 0.61–1.24)
GFR calc Af Amer: >60 mL/min (ref >60)
GFR calc non Af Amer: >60 mL/min (ref >60)
Glucose, Bld: 139 mg/dL — ABNORMAL HIGH (ref 70–99)
Potassium: 4.2 mmol/L (ref 3.5–5.1)
Sodium: 135 mmol/L (ref 135–145)
Total Bilirubin: 0.7 mg/dL (ref 0.3–1.2)
Total Protein: 7.9 g/dL (ref 6.5–8.1)

## 2019-04-17 LAB — LIPASE, BLOOD: Lipase: 62 U/L — ABNORMAL HIGH (ref 11–51)

## 2019-04-17 LAB — URINALYSIS, COMPLETE (UACMP) WITH MICROSCOPIC
Bacteria, UA: NONE SEEN
Bilirubin Urine: NEGATIVE
Glucose, UA: NEGATIVE mg/dL
Hgb urine dipstick: NEGATIVE
Ketones, ur: NEGATIVE mg/dL
Leukocytes,Ua: NEGATIVE
Nitrite: NEGATIVE
Protein, ur: NEGATIVE mg/dL
Specific Gravity, Urine: 1.008 (ref 1.005–1.030)
WBC, UA: NONE SEEN WBC/hpf (ref 0–5)
pH: 7 (ref 5.0–8.0)

## 2019-04-17 LAB — CBC
HCT: 44.7 % (ref 39.0–52.0)
Hemoglobin: 16 g/dL (ref 13.0–17.0)
MCH: 30.1 pg (ref 26.0–34.0)
MCHC: 35.8 g/dL (ref 30.0–36.0)
MCV: 84.2 fL (ref 80.0–100.0)
Platelets: 182 10*3/uL (ref 150–400)
RBC: 5.31 MIL/uL (ref 4.22–5.81)
RDW: 11.8 % (ref 11.5–15.5)
WBC: 3.9 10*3/uL — ABNORMAL LOW (ref 4.0–10.5)
nRBC: 0 % (ref 0.0–0.2)

## 2019-04-17 MED ORDER — SODIUM CHLORIDE 0.9 % IV SOLN
1000.0000 mL | Freq: Once | INTRAVENOUS | Status: AC
  Administered 2019-04-17: 1000 mL via INTRAVENOUS

## 2019-04-17 MED ORDER — ONDANSETRON HCL 4 MG/2ML IJ SOLN
4.0000 mg | Freq: Once | INTRAMUSCULAR | Status: AC
  Administered 2019-04-17: 4 mg via INTRAVENOUS
  Filled 2019-04-17: qty 2

## 2019-04-17 MED ORDER — ONDANSETRON 4 MG PO TBDP
4.0000 mg | ORAL_TABLET | Freq: Once | ORAL | Status: AC
  Administered 2019-04-17: 4 mg via ORAL
  Filled 2019-04-17: qty 1

## 2019-04-17 MED ORDER — ONDANSETRON 4 MG PO TBDP: 4.0000 mg | ORAL_TABLET | Freq: Three times a day (TID) | ORAL | 0 refills | Status: AC | PRN

## 2019-04-17 NOTE — ED Notes (Signed)
Pt resting in bed, states "i'm feeling rough." States he had diarrhea about 12 times early this am. Pain to LLQ.

## 2019-04-17 NOTE — ED Triage Notes (Signed)
Pt states NVD that started last pm and abd pain to left side. Pt states he does not know if he has food poisoning or what.

## 2019-04-17 NOTE — ED Notes (Addendum)
Pt states he drank 17-18 beers yesterday afternoon, but normally doesn't make him sick. Has also forgotten to take his Celexa the past 2 days, states his symptoms could be withdrawals from that as well.

## 2019-04-17 NOTE — ED Provider Notes (Signed)
[facility] Emergency Department Provider Note  ____________________________________________   First MD Initiated Contact with Patient 04/17/19 1019     (approximate)   I have reviewed the triage vital signs and the nursing notes.   Patient has been triaged with a MSE exam performed by myself at a minimum. Based on symptoms and screening exam, patient may receive a more in-depth exam, labs, imaging as detailed below. Patients have been advised of this setting and exam type at the time of patient interview.    HISTORY  Chief Complaint Nausea, Emesis, Diabetes, and Abdominal Cramping    HPI Erik Mckay is a 38 y.o. male that presents to the emergency department with a complaint of with PMH diabetes, HTN, GERD that presents to the emergency department for evaluation of nausea, vomiting, diarrhea, LUQ pain since last night. Patient has had 4 episodes of vomiting and after, dry heaving. He has not eaten yet today. Patient is concerned that may have food poisioning. He ate br[place] stew and left over Christmas food last night. He has forgotten to take his Celexa for the last two days. He does not take anything for his HTN. He drinks "quite a bit of alcohol." He drank > 15 beers yesterday. He has never had a gallstone. No fevers.  Patient will receive a medical screening exam as detailed below.  Based off of this exam, more in depth exam, labs, imaging will be performed as needed for complaint.  Patient care will be eventually transferred to another provider in the emergency department for final exam, diagnosis and disposition.    Past Medical History:  Diagnosis Date  . Anxiety   . Hypertension     There are no problems to display for this patient.   History reviewed. No pertinent surgical history.  Prior to Admission medications   Medication Sig Start Date End Date Taking? Authorizing Provider  citalopram (CELEXA) 20 MG tablet Take 20 mg by mouth  daily.     [provider]  naproxen (NAPROSYN) 500 MG tablet Take 1 tablet (500 mg total) by mouth 2 (two) times daily with a meal. 10/18/18   Triplett, Cari B, FNP  omeprazole (PRILOSEC) 20 MG capsule Take 20 mg by mouth daily.    [provider]  ondansetron (ZOFRAN ODT) 4 MG disintegrating tablet Take 1 tablet (4 mg total) by mouth every 8 (eight) hours as needed. 04/17/19   Lavonia Drafts, MD    Allergies Bactrim [sulfamethoxazole-trimethoprim], Levaquin [levofloxacin in d5w], and Phenergan [promethazine hcl]  No family history on file.  Social History Social History   Tobacco Use  . Smoking status: Never Smoker  . Smokeless tobacco: Current User  Substance Use Topics  . Alcohol use: Yes  . Drug use: No    Review of Systems Constitutional: No fever ENT: No nasal congestion/rhinorhea. No sore throat Cardiovascular: No chest pain. Respiratory: No cough. No shortness of breath/difficulty breathing Gastroenterology: Positive for abdominal pain. Positive for nausea, vomiting, diarrhea. Musculoskeletal: No for musculoskeletal pain Integumentary: Negative for rash. Neurological: No focal weakness nor numbness.   ____________________________________________   PHYSICAL EXAM:  VITAL SIGNS: ED Triage Vitals  Enc Vitals Group     BP 04/17/19 0848 (!) 183/104     Pulse Rate 04/17/19 0848 76     Resp 04/17/19 0848 18     Temp 04/17/19 0848 98.4 F (36.9 C)     Temp Source 04/17/19 0848 Oral     SpO2 04/17/19 0848 99 %  Weight --      Height --      Head Circumference --      Peak Flow --      Pain Score 04/17/19 0852 5     Pain Loc --      Pain Edu? --      Excl. in GC? --     Constitutional: Alert and oriented. Generally well appearing and in no acute distress. Eyes: Conjunctivae are normal.  Cardiovascular: Grossly normal heart sounds. Respiratory: Normal respiratory effort without significant tachypnea and no observed retractions.   Gastrointestinal: No significant visible abdominal wall findings. Musculoskeletal: No gross deformities of extremities. Neurologic:  Normal speech and language. No gross focal neurologic deficits are appreciated.  Skin:  Skin is warm, dry and intact. No rash noted.    ____________________________________________   LABS (all labs ordered are listed, but only abnormal results are displayed)  Labs Reviewed  LIPASE, BLOOD - Abnormal; Notable for the following components:      Result Value   Lipase 62 (*)    All other components within normal limits  COMPREHENSIVE METABOLIC PANEL - Abnormal; Notable for the following components:   Glucose, Bld 139 (*)    AST 73 (*)    ALT 82 (*)    All other components within normal limits  CBC - Abnormal; Notable for the following components:   WBC 3.9 (*)    All other components within normal limits  URINALYSIS, COMPLETE (UACMP) WITH MICROSCOPIC - Abnormal; Notable for the following components:   Color, Urine STRAW (*)    APPearance CLEAR (*)    All other components within normal limits    ____________________________________________   RADIOLOGY   Official radiology report(s): No results found.  ____________________________________________    INITIAL IMPRESSION / MDM / ASSESSMENT AND PLAN / ED COURSE    Basic labs including CMP, CBC and lipase were ordered.  Patient was given a Zofran for nausea.      Patient has been screened based based on their arrival complaint, evaluated for an emergent condition, and at a minimum has received a medical screening exam.  At this time, patient will receive further work-up as determined by medical screening exam.  Patient care will eventually be transferred to another provider in the emergency department for final diagnosis and disposition.    ____________________________________________  Note:  This document was prepared using Conservation officer, historic buildings and may include unintentional  dictation errors.   Enid Derry, PA-C 04/17/19 1515    Jene Every, MD 04/17/19 [phone]

## 2020-10-21 ENCOUNTER — Emergency Department
Admission: EM | Admit: 2020-10-21 | Discharge: 2020-10-21 | Disposition: A | Discharge status: Home / Self Care | Payer: Self-pay | Attending: Emergency Medicine | Admitting: Emergency Medicine

## 2020-10-21 ENCOUNTER — Other Ambulatory Visit: Payer: Self-pay

## 2020-10-21 DIAGNOSIS — F410 Panic disorder [episodic paroxysmal anxiety] without agoraphobia: Secondary | ICD-10-CM

## 2020-10-21 DIAGNOSIS — F419 Anxiety disorder, unspecified: Secondary | ICD-10-CM | POA: Insufficient documentation

## 2020-10-21 DIAGNOSIS — K2920 Alcoholic gastritis without bleeding: Secondary | ICD-10-CM

## 2020-10-21 DIAGNOSIS — I10 Essential (primary) hypertension: Secondary | ICD-10-CM | POA: Insufficient documentation

## 2020-10-21 LAB — COMPREHENSIVE METABOLIC PANEL
ALT: 121 U/L — ABNORMAL HIGH (ref 0–44)
AST: 88 U/L — ABNORMAL HIGH (ref 15–41)
Albumin: 4.2 g/dL (ref 3.5–5.0)
Alkaline Phosphatase: 61 U/L (ref 38–126)
Anion gap: 9 (ref 5–15)
BUN: 9 mg/dL (ref 6–20)
CO2: 23 mmol/L (ref 22–32)
Calcium: 9.1 mg/dL (ref 8.9–10.3)
Chloride: 101 mmol/L (ref 98–111)
Creatinine, Ser: 1.19 mg/dL (ref 0.61–1.24)
GFR, Estimated: >60 mL/min (ref >60)
Glucose, Bld: 133 mg/dL — ABNORMAL HIGH (ref 70–99)
Potassium: 3.6 mmol/L (ref 3.5–5.1)
Sodium: 133 mmol/L — ABNORMAL LOW (ref 135–145)
Total Bilirubin: 0.6 mg/dL (ref 0.3–1.2)
Total Protein: 7.7 g/dL (ref 6.5–8.1)

## 2020-10-21 LAB — CBC
HCT: 43.7 % (ref 39.0–52.0)
Hemoglobin: 15.7 g/dL (ref 13.0–17.0)
MCH: 31.5 pg (ref 26.0–34.0)
MCHC: 35.9 g/dL (ref 30.0–36.0)
MCV: 87.6 fL (ref 80.0–100.0)
Platelets: 185 10*3/uL (ref 150–400)
RBC: 4.99 MIL/uL (ref 4.22–5.81)
RDW: 12.2 % (ref 11.5–15.5)
WBC: 6 10*3/uL (ref 4.0–10.5)
nRBC: 0 % (ref 0.0–0.2)

## 2020-10-21 LAB — LIPASE, BLOOD: Lipase: 62 U/L — ABNORMAL HIGH (ref 11–51)

## 2020-10-21 LAB — URINALYSIS, COMPLETE (UACMP) WITH MICROSCOPIC
Bacteria, UA: NONE SEEN
Bilirubin Urine: NEGATIVE
Glucose, UA: NEGATIVE mg/dL
Hgb urine dipstick: NEGATIVE
Ketones, ur: NEGATIVE mg/dL
Leukocytes,Ua: NEGATIVE
Nitrite: NEGATIVE
Protein, ur: NEGATIVE mg/dL
Specific Gravity, Urine: 1.005 (ref 1.005–1.030)
Squamous Epithelial / HPF: NONE SEEN (ref 0–5)
pH: 6 (ref 5.0–8.0)

## 2020-10-21 NOTE — ED Notes (Signed)
See triage note  Presents with LUQ pain which started this am     States he has not had n/v  But felt shaky

## 2020-10-21 NOTE — ED Provider Notes (Signed)
[facility] Emergency Department Provider Note  ____________________________________________  Time seen: Approximately 1:43 PM  I have reviewed the triage vital signs and the nursing notes.   HISTORY  Chief Complaint Abdominal Pain    HPI Erik Mckay is a 40 y.o. male with a past history of hypertension and anxiety who comes to the ED complaining of left upper quadrant abdominal pain which started this morning on waking up.  No nausea vomiting or diarrhea.  No aggravating or alleviating factors, nonradiating.  He noticed that he was burping a lot and felt sweaty and then he became very anxious so he came to the ED for evaluation.  He reports being in his usual state of health all throughout yesterday.  He was celebrating the holiday, so he ate pizza and sausage and was outside all day and had an estimated 20 beers.  No hematemesis or black or bloody stool.  Denies a history of alcohol withdrawal or cirrhosis    Past Medical History:  Diagnosis Date   Anxiety    Hypertension      There are no problems to display for this patient.    No past surgical history on file.   Prior to Admission medications   Medication Sig Start Date End Date Taking? Authorizing Provider  citalopram (CELEXA) 20 MG tablet Take 20 mg by mouth daily.     [provider]  naproxen (NAPROSYN) 500 MG tablet Take 1 tablet (500 mg total) by mouth 2 (two) times daily with a meal. 10/18/18   Triplett, Cari B, FNP  omeprazole (PRILOSEC) 20 MG capsule Take 20 mg by mouth daily.    [provider]  ondansetron (ZOFRAN ODT) 4 MG disintegrating tablet Take 1 tablet (4 mg total) by mouth every 8 (eight) hours as needed. 04/17/19   Jene Every, MD     Allergies Bactrim [sulfamethoxazole-trimethoprim], Levaquin [levofloxacin in d5w], and Phenergan [promethazine hcl]   No family history on file.  Social History Social History   Tobacco Use   Smoking status:  Never   Smokeless tobacco: Current  Substance Use Topics   Alcohol use: Yes   Drug use: No    Review of Systems  Constitutional:   No fever or chills.  ENT:   No sore throat. No rhinorrhea. Cardiovascular:   No chest pain or syncope. Respiratory:   No dyspnea or cough. Gastrointestinal:   Positive for left upper quadrant abdominal pain without vomiting and diarrhea.  Musculoskeletal:   Negative for focal pain or swelling All other systems reviewed and are negative except as documented above in ROS and HPI.  ____________________________________________   PHYSICAL EXAM:  VITAL SIGNS: ED Triage Vitals  Enc Vitals Group     BP 10/21/20 1143 (!) 177/106     Pulse Rate 10/21/20 1143 (!) 108     Resp 10/21/20 1143 20     Temp 10/21/20 1143 98.3 F (36.8 C)     Temp Source 10/21/20 1143 Oral     SpO2 10/21/20 1143 99 %     Weight 10/21/20 1144 210 lb (95.3 kg)     Height 10/21/20 1144 5\' 8"  (1.727 m)     Head Circumference --      Peak Flow --      Pain Score 10/21/20 1156 7     Pain Loc --      Pain Edu? --      Excl. in GC? --     Vital signs reviewed, nursing  assessments reviewed.   Constitutional:   Alert and oriented. Non-toxic appearance. Eyes:   Conjunctivae are normal. EOMI. PERRL. ENT      Head:   Normocephalic and atraumatic.      Nose:   Wearing a mask.      Mouth/Throat:   Wearing a mask.      Neck:   No meningismus. Full ROM. Hematological/Lymphatic/Immunilogical:   No cervical lymphadenopathy. Cardiovascular:   RRR.   No murmurs. Cap refill less than 2 seconds. Respiratory:   Normal respiratory effort without tachypnea/retractions. Breath sounds are clear and equal bilaterally. No wheezes/rales/rhonchi. Gastrointestinal:   Soft and nontender. Non distended. There is no CVA tenderness.  No rebound, rigidity, or guarding. Musculoskeletal:   Normal range of motion in all extremities. No joint effusions.  No lower extremity tenderness.  No edema. Neurologic:    Normal speech and language.  Motor grossly intact. No acute focal neurologic deficits are appreciated.  Skin:    Skin is warm, dry and intact. No rash noted.  No petechiae, purpura, or bullae.  ____________________________________________    LABS (pertinent positives/negatives) (all labs ordered are listed, but only abnormal results are displayed) Labs Reviewed  LIPASE, BLOOD - Abnormal; Notable for the following components:      Result Value   Lipase 62 (*)    All other components within normal limits  COMPREHENSIVE METABOLIC PANEL - Abnormal; Notable for the following components:   Sodium 133 (*)    Glucose, Bld 133 (*)    AST 88 (*)    ALT 121 (*)    All other components within normal limits  URINALYSIS, COMPLETE (UACMP) WITH MICROSCOPIC - Abnormal; Notable for the following components:   Color, Urine STRAW (*)    APPearance CLEAR (*)    All other components within normal limits  CBC   ____________________________________________   EKG    ____________________________________________    RADIOLOGY  No results found.  ____________________________________________   PROCEDURES Procedures  ____________________________________________    CLINICAL IMPRESSION / ASSESSMENT AND PLAN / ED COURSE  Medications ordered in the ED: Medications - No data to display  Pertinent labs & imaging results that were available during my care of the patient were reviewed by me and considered in my medical decision making (see chart for details).  Erik Mckay was evaluated in Emergency Department on 10/21/2020 for the symptoms described in the history of present illness. He was evaluated in the context of the global COVID-19 pandemic, which necessitated consideration that the patient might be at risk for infection with the SARS-CoV-2 virus that causes COVID-19. Institutional protocols and algorithms that pertain to the evaluation of patients at risk for COVID-19 are in a state  of rapid change based on information released by regulatory bodies including the CDC and federal and state organizations. These policies and algorithms were followed during the patient's care in the ED.   Patient presents with left upper quadrant abdominal pain highly suspicious for GERD/alcoholic gastritis.  He now feels much better.  Heart rate is 80 on my exam.  Abdomen is soft and benign.  Considering the patient's symptoms, medical history, and physical examination today, I have low suspicion for cholecystitis or biliary pathology, pancreatitis, perforation or bowel obstruction, hernia, intra-abdominal abscess, AAA or dissection, volvulus or intussusception, mesenteric ischemia, or appendicitis.  He is taking Prilosec daily.  Recommended he start taking his Carafate again for the next few days, drink lots of water for hydration.  Not a daily drinker, not  at risk for withdrawal syndrome.  Labs are reassuring and he stable for discharge      ____________________________________________   FINAL CLINICAL IMPRESSION(S) / ED DIAGNOSES    Final diagnoses:  Acute alcoholic gastritis without hemorrhage  Panic attack     ED Discharge Orders     None       Portions of this note were generated with dragon dictation software. Dictation errors may occur despite best attempts at proofreading.   Sharman Cheek, MD 10/21/20 1346

## 2020-10-21 NOTE — ED Triage Notes (Signed)
Pt here with LUQ pain that started this AM. Pt denies N/V/D. Pt states that he has mostly been burping and sweating. Pt states that it could be a panic attack and he took something for it but it has not helped.

## 2021-01-21 IMAGING — CR CHEST - 2 VIEW
2 series · 2 of 2 positions shown · non-contrast
Comparison: 09/07/2013

CLINICAL DATA: Left-sided chest pain

EXAM:
CHEST - 2 VIEW

[chest pa]
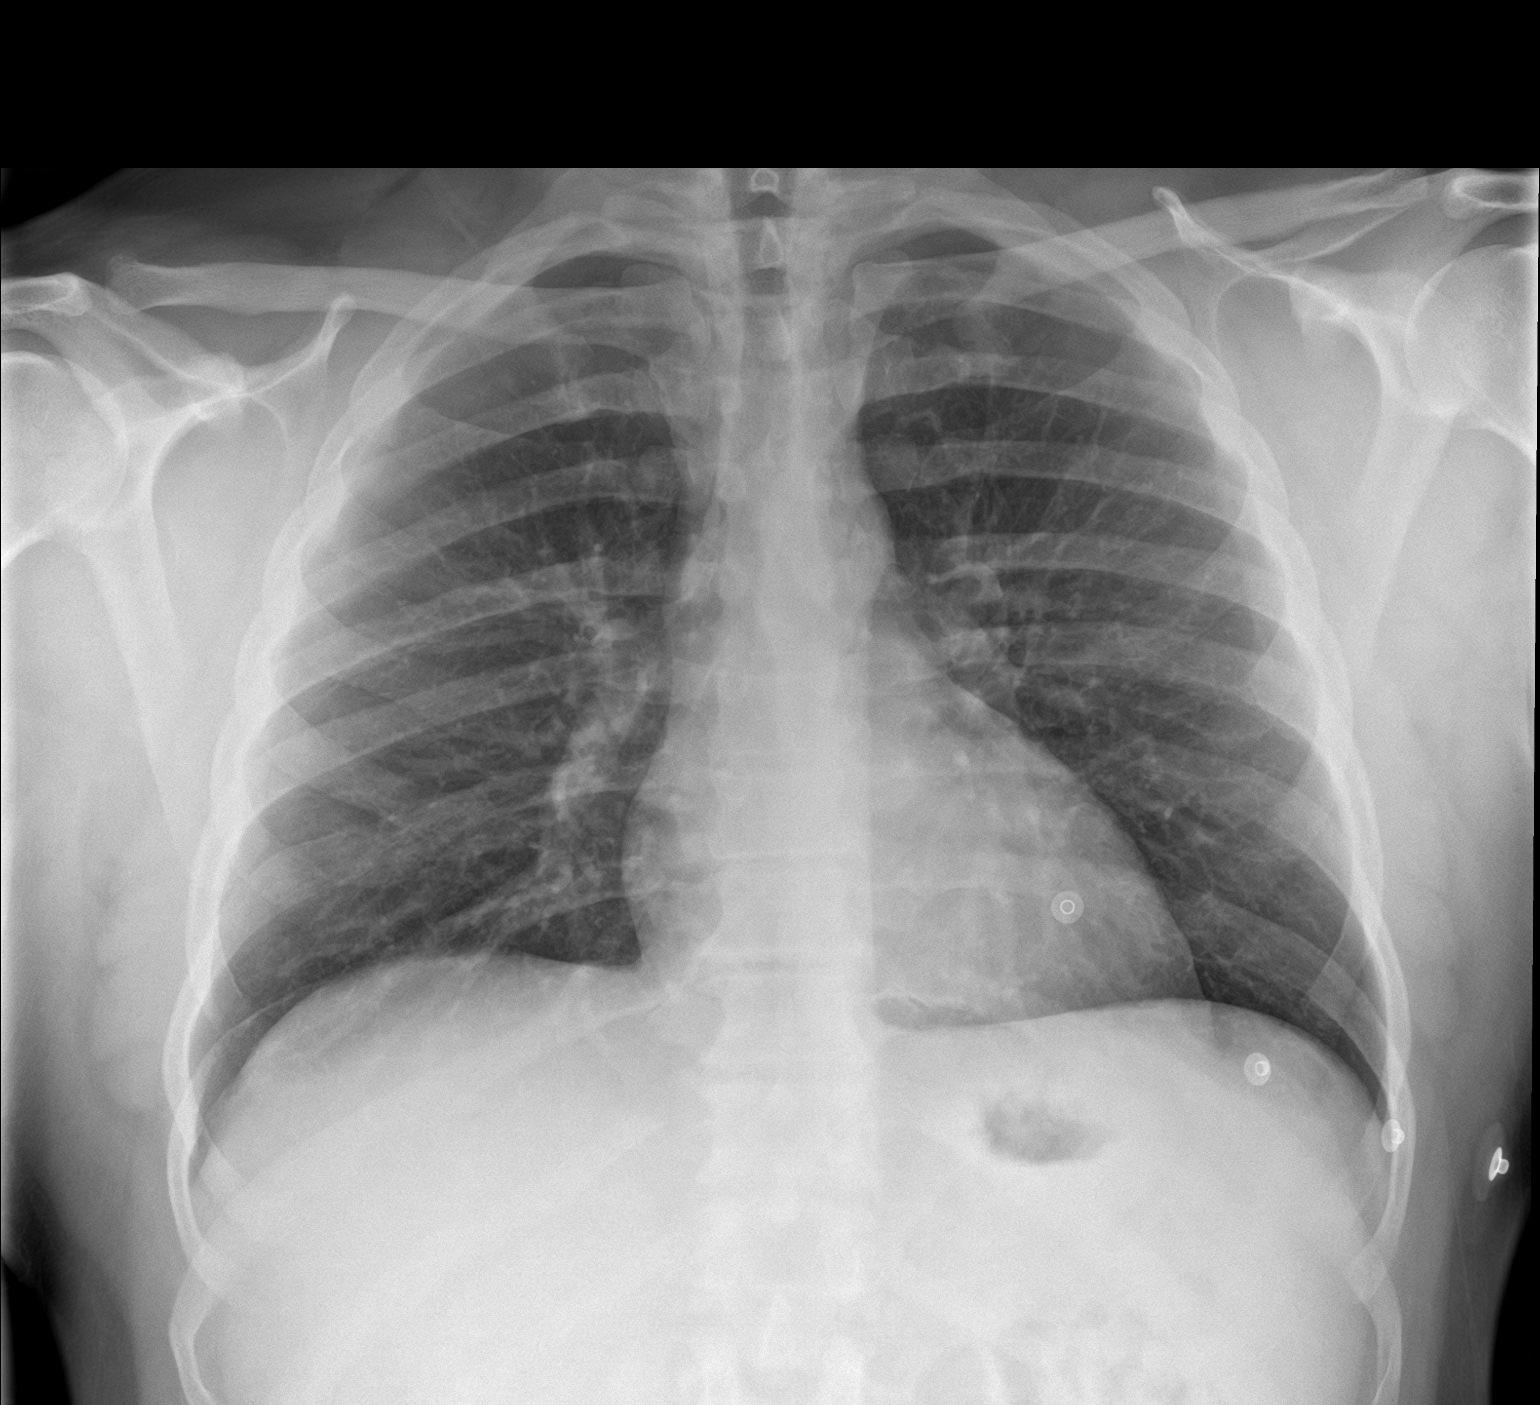

[chest lat]
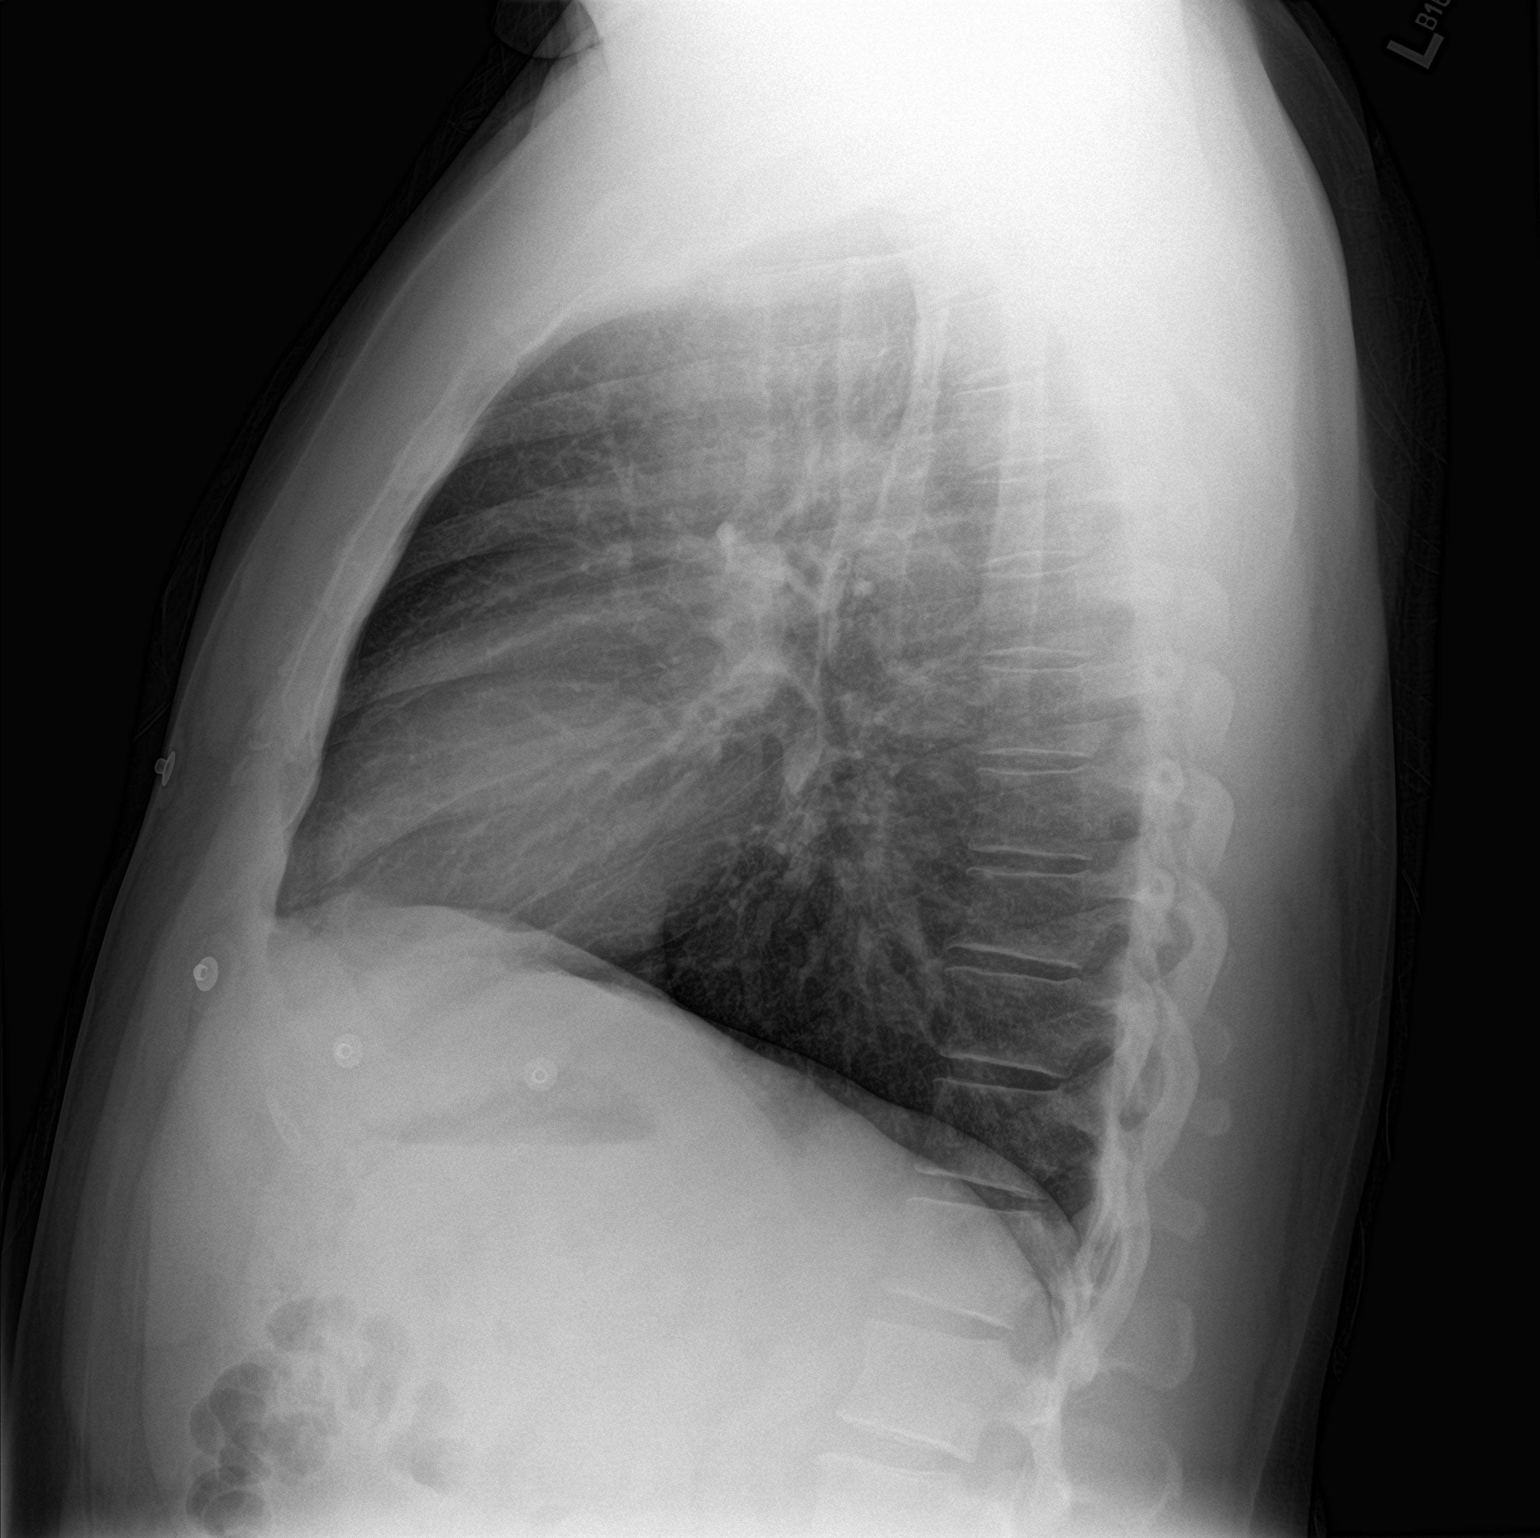

[2 of 2 positions shown; findings below may reference images not displayed]

FINDINGS: The heart size and mediastinal contours are within normal limits.
Both lungs are clear. The visualized skeletal structures are
unremarkable.
IMPRESSION: No active cardiopulmonary disease.

## 2023-10-26 ENCOUNTER — Emergency Department
Admission: EM | Admit: 2023-10-26 | Discharge: 2023-10-26 | Disposition: A | Discharge status: Home / Self Care | Payer: Self-pay | Attending: Emergency Medicine | Admitting: Emergency Medicine

## 2023-10-26 DIAGNOSIS — R42 Dizziness and giddiness: Secondary | ICD-10-CM | POA: Insufficient documentation

## 2023-10-26 DIAGNOSIS — E860 Dehydration: Secondary | ICD-10-CM | POA: Diagnosis not present

## 2023-10-26 DIAGNOSIS — I10 Essential (primary) hypertension: Secondary | ICD-10-CM | POA: Insufficient documentation

## 2023-10-26 LAB — COMPREHENSIVE METABOLIC PANEL WITH GFR
ALT: 100 U/L — ABNORMAL HIGH (ref 0–44)
AST: 84 U/L — ABNORMAL HIGH (ref 15–41)
Albumin: 3.9 g/dL (ref 3.5–5.0)
Alkaline Phosphatase: 45 U/L (ref 38–126)
Anion gap: 13 (ref 5–15)
BUN: 11 mg/dL (ref 6–20)
CO2: 21 mmol/L — ABNORMAL LOW (ref 22–32)
Calcium: 9.5 mg/dL (ref 8.9–10.3)
Chloride: 105 mmol/L (ref 98–111)
Creatinine, Ser: 0.94 mg/dL (ref 0.61–1.24)
GFR, Estimated: >60 mL/min (ref >60)
Glucose, Bld: 114 mg/dL — ABNORMAL HIGH (ref 70–99)
Potassium: 3.9 mmol/L (ref 3.5–5.1)
Sodium: 139 mmol/L (ref 135–145)
Total Bilirubin: 0.8 mg/dL (ref 0.0–1.2)
Total Protein: 7.2 g/dL (ref 6.5–8.1)

## 2023-10-26 LAB — URINALYSIS, ROUTINE W REFLEX MICROSCOPIC
Bilirubin Urine: NEGATIVE
Glucose, UA: NEGATIVE mg/dL
Hgb urine dipstick: NEGATIVE
Ketones, ur: NEGATIVE mg/dL
Leukocytes,Ua: NEGATIVE
Nitrite: NEGATIVE
Protein, ur: NEGATIVE mg/dL
Specific Gravity, Urine: 1.011 (ref 1.005–1.030)
pH: 7 (ref 5.0–8.0)

## 2023-10-26 LAB — CBC
HCT: 39.8 % (ref 39.0–52.0)
Hemoglobin: 14.5 g/dL (ref 13.0–17.0)
MCH: 31.6 pg (ref 26.0–34.0)
MCHC: 36.4 g/dL — ABNORMAL HIGH (ref 30.0–36.0)
MCV: 86.7 fL (ref 80.0–100.0)
Platelets: 195 K/uL (ref 150–400)
RBC: 4.59 MIL/uL (ref 4.22–5.81)
RDW: 11.8 % (ref 11.5–15.5)
WBC: 4.1 K/uL (ref 4.0–10.5)
nRBC: 0 % (ref 0.0–0.2)

## 2023-10-26 LAB — TROPONIN I (HIGH SENSITIVITY): Troponin I (High Sensitivity): 2 ng/L (ref <18)

## 2023-10-26 MED ORDER — LACTATED RINGERS IV BOLUS
1000.0000 mL | Freq: Once | INTRAVENOUS | Status: AC
  Administered 2023-10-26: 1000 mL via INTRAVENOUS

## 2023-10-26 NOTE — ED Triage Notes (Signed)
 Pt presents to the ED via [facility] from home. Pt drove to the EMS station this morning. States that he woke up at 3am and was dizzy. EMS reports that patient was diaphoretic and orthostatic positive. Pt is dizzy with movement and standing. Pt A&Ox4  Sitting BP 157/104 HR 100 Standing BP 120/90 HR 120  CBG 119 99% on RA  20g IV RT AC

## 2023-10-26 NOTE — ED Notes (Signed)
 Pt is CAOx4, breathing normally, and normal in color. Pt is complaining of feeling dizzy all day today. Pt reports he feels like the room is spinning. Pt reports heavy alcohol use over the weekend and yesterday, however denies ever having hangovers. Pt denies any needs at this time and is in NAD.

## 2023-10-26 NOTE — ED Provider Notes (Signed)
 [facility] Provider Note   Event Date/Time   First MD Initiated Contact with Patient 10/26/23 1105     (approximate) History  Dizziness  HPI DMARION PERFECT is a 43 y.o. male with a past medical history of daily alcohol abuse, hypertension, and anxiety presents via EMS after patient been complaining of orthostatic lightheadedness.  Patient had a sitting blood pressure of 157/104 and standing blood pressure of 120/90 with EMS.  Patient states that he was feeling diaphoretic and lightheaded whenever he was getting up from a seated position.  Patient denies any other exacerbating symptoms.  Patient states the symptoms partially resolve when sitting down or laying down ROS: Patient currently denies any vision changes, tinnitus, difficulty speaking, facial droop, sore throat, chest pain, shortness of breath, abdominal pain, nausea/vomiting/diarrhea, dysuria, or weakness/numbness/paresthesias in any extremity   Physical Exam  Triage Vital Signs: ED Triage Vitals  Encounter Vitals Group     BP 10/26/23 0949 (!) 151/93     Girls Systolic BP Percentile --      Girls Diastolic BP Percentile --      Boys Systolic BP Percentile --      Boys Diastolic BP Percentile --      Pulse Rate 10/26/23 0949 75     Resp 10/26/23 0949 18     Temp 10/26/23 0950 98.4 F (36.9 C)     Temp Source 10/26/23 0950 Oral     SpO2 10/26/23 0949 100 %     Weight 10/26/23 0950 200 lb (90.7 kg)     Height 10/26/23 0950 5' 8 (1.727 m)     Head Circumference --      Peak Flow --      Pain Score 10/26/23 0949 0     Pain Loc --      Pain Education --      Exclude from Growth Chart --    Most recent vital signs: Vitals:   10/26/23 0950 10/26/23 1123  BP:  (!) 144/93  Pulse:  63  Resp:  18  Temp: 98.4 F (36.9 C)   SpO2:  100%   General: Awake, oriented x4. CV:  Good peripheral perfusion. Resp:  Normal effort. Abd:  No distention. Other:  Middle-aged overweight Caucasian male  resting comfortably in no acute distress ED Results / Procedures / Treatments  Labs (all labs ordered are listed, but only abnormal results are displayed) Labs Reviewed  COMPREHENSIVE METABOLIC PANEL WITH GFR - Abnormal; Notable for the following components:      Result Value   CO2 21 (*)    Glucose, Bld 114 (*)    AST 84 (*)    ALT 100 (*)    All other components within normal limits  CBC - Abnormal; Notable for the following components:   MCHC 36.4 (*)    All other components within normal limits  URINALYSIS, ROUTINE W REFLEX MICROSCOPIC - Abnormal; Notable for the following components:   Color, Urine YELLOW (*)    APPearance CLEAR (*)    All other components within normal limits  TROPONIN I (HIGH SENSITIVITY)   EKG ED ECG REPORT I, Artist Erik Mckay, the attending physician, personally viewed and interpreted this ECG. Date: 10/26/2023 EKG Time: 0956 Rate: 78 Rhythm: normal sinus rhythm QRS Axis: normal Intervals: normal ST/T Wave abnormalities: normal Narrative Interpretation: no evidence of acute ischemia PROCEDURES: Critical Care performed: No Procedures MEDICATIONS ORDERED IN ED: Medications  lactated ringers  bolus 1,000 mL (0 mLs Intravenous Stopped  10/26/23 1236)   IMPRESSION / MDM / ASSESSMENT AND PLAN / ED COURSE  I reviewed the triage vital signs and the nursing notes.                             The patient is on the cardiac monitor to evaluate for evidence of arrhythmia and/or significant heart rate changes. Patient's presentation is most consistent with acute presentation with potential threat to life or bodily function. This patient presents with generalized weakness and fatigue likely secondary to dehydration and early alcohol withdrawal. Considered alternate etiologies of the patient's symptoms including infectious processes, severe metabolic derangements or electrolyte abnormalities, ischemia/ACS, heart failure, and intracranial/central processes but think  these are unlikely given the history and physical exam.  Plan: labs, 1L IV fluid resuscitation, pain/nausea control, reassessment  Dispo: Discharge home with PCP follow-up   FINAL CLINICAL IMPRESSION(S) / ED DIAGNOSES   Final diagnoses:  Lightheadedness  Orthostatic dizziness  Dehydration   Rx / DC Orders   ED Discharge Orders     None      Note:  This document was prepared using Dragon voice recognition software and may include unintentional dictation errors.   Jossie Artist POUR, MD 10/26/23 1257
